# Patient Record
Sex: Female | Born: 1965 | Race: White | Hispanic: No | Smoking: Never smoker
Health system: Southern US, Community
[De-identification: ages and names within clinical notes are randomized; demographics above are authoritative.]

## PROBLEM LIST (undated history)

## (undated) DIAGNOSIS — E785 Hyperlipidemia, unspecified: Secondary | ICD-10-CM

## (undated) DIAGNOSIS — F5104 Psychophysiologic insomnia: Secondary | ICD-10-CM

## (undated) DIAGNOSIS — C4491 Basal cell carcinoma of skin, unspecified: Secondary | ICD-10-CM

## (undated) DIAGNOSIS — T7840XA Allergy, unspecified, initial encounter: Secondary | ICD-10-CM

## (undated) DIAGNOSIS — F419 Anxiety disorder, unspecified: Secondary | ICD-10-CM

## (undated) HISTORY — DX: Anxiety disorder, unspecified: F41.9

## (undated) HISTORY — PX: AUGMENTATION MAMMAPLASTY: SUR837

## (undated) HISTORY — DX: Psychophysiologic insomnia: F51.04

## (undated) HISTORY — DX: Allergy, unspecified, initial encounter: T78.40XA

## (undated) HISTORY — PX: COSMETIC SURGERY: SHX468

## (undated) HISTORY — DX: Hyperlipidemia, unspecified: E78.5

## (undated) HISTORY — DX: Basal cell carcinoma of skin, unspecified: C44.91

## (undated) HISTORY — PX: SALPINGOOPHORECTOMY: SHX82

---

## 1999-06-02 ENCOUNTER — Encounter: Payer: Self-pay | Admitting: Emergency Medicine

## 1999-06-02 ENCOUNTER — Emergency Department (HOSPITAL_COMMUNITY): Admission: EM | Admit: 1999-06-02 | Discharge: 1999-06-02 | Payer: Self-pay | Admitting: Emergency Medicine

## 2004-05-27 ENCOUNTER — Emergency Department: Payer: Self-pay | Admitting: Emergency Medicine

## 2004-05-28 ENCOUNTER — Ambulatory Visit: Payer: Self-pay | Admitting: Emergency Medicine

## 2005-06-26 ENCOUNTER — Emergency Department: Payer: Self-pay | Admitting: Emergency Medicine

## 2006-03-14 ENCOUNTER — Encounter: Payer: Self-pay | Admitting: Family Medicine

## 2006-04-07 ENCOUNTER — Ambulatory Visit: Payer: Self-pay | Admitting: Family Medicine

## 2006-04-09 ENCOUNTER — Ambulatory Visit: Payer: Self-pay | Admitting: Family Medicine

## 2006-04-09 LAB — CONVERTED CEMR LAB
ALT: 17 units/L (ref 0–40)
AST: 17 units/L (ref 0–37)
Albumin: 3.8 g/dL (ref 3.5–5.2)
Alkaline Phosphatase: 29 units/L — ABNORMAL LOW (ref 39–117)
BUN: 8 mg/dL (ref 6–23)
Calcium: 8.7 mg/dL (ref 8.4–10.5)
Chloride: 107 meq/L (ref 96–112)
Creatinine, Ser: 0.8 mg/dL (ref 0.4–1.2)
Direct LDL: 171.6 mg/dL
GFR calc non Af Amer: 84 mL/min
Sodium: 140 meq/L (ref 135–145)
Total Bilirubin: 0.6 mg/dL (ref 0.3–1.2)
Total CHOL/HDL Ratio: 6
VLDL: 10 mg/dL (ref 0–40)

## 2006-04-11 ENCOUNTER — Other Ambulatory Visit: Admission: RE | Admit: 2006-04-11 | Discharge: 2006-04-11 | Payer: Self-pay | Admitting: Family Medicine

## 2006-04-11 ENCOUNTER — Ambulatory Visit: Payer: Self-pay | Admitting: Gastroenterology

## 2006-04-11 ENCOUNTER — Encounter: Payer: Self-pay | Admitting: Gastroenterology

## 2006-04-11 ENCOUNTER — Ambulatory Visit: Payer: Self-pay | Admitting: Family Medicine

## 2006-04-14 ENCOUNTER — Ambulatory Visit: Payer: Self-pay | Admitting: Family Medicine

## 2006-04-18 ENCOUNTER — Ambulatory Visit (HOSPITAL_COMMUNITY): Admission: RE | Admit: 2006-04-18 | Discharge: 2006-04-18 | Payer: Self-pay | Admitting: Family Medicine

## 2006-04-24 ENCOUNTER — Ambulatory Visit: Payer: Self-pay | Admitting: Gastroenterology

## 2006-05-01 ENCOUNTER — Ambulatory Visit: Payer: Self-pay | Admitting: Family Medicine

## 2006-05-07 ENCOUNTER — Encounter: Admission: RE | Admit: 2006-05-07 | Discharge: 2006-05-07 | Payer: Self-pay | Admitting: Family Medicine

## 2006-07-16 ENCOUNTER — Telehealth (INDEPENDENT_AMBULATORY_CARE_PROVIDER_SITE_OTHER): Payer: Self-pay | Admitting: *Deleted

## 2006-07-17 ENCOUNTER — Ambulatory Visit: Payer: Self-pay | Admitting: Family Medicine

## 2006-08-05 ENCOUNTER — Telehealth (INDEPENDENT_AMBULATORY_CARE_PROVIDER_SITE_OTHER): Payer: Self-pay | Admitting: *Deleted

## 2006-10-06 ENCOUNTER — Ambulatory Visit: Payer: Self-pay | Admitting: Family Medicine

## 2006-10-06 DIAGNOSIS — R079 Chest pain, unspecified: Secondary | ICD-10-CM | POA: Insufficient documentation

## 2006-10-06 DIAGNOSIS — F411 Generalized anxiety disorder: Secondary | ICD-10-CM | POA: Insufficient documentation

## 2006-10-08 ENCOUNTER — Ambulatory Visit: Payer: Self-pay | Admitting: Internal Medicine

## 2006-10-15 ENCOUNTER — Telehealth (INDEPENDENT_AMBULATORY_CARE_PROVIDER_SITE_OTHER): Payer: Self-pay | Admitting: *Deleted

## 2006-10-29 ENCOUNTER — Encounter: Payer: Self-pay | Admitting: Family Medicine

## 2006-10-29 DIAGNOSIS — R87619 Unspecified abnormal cytological findings in specimens from cervix uteri: Secondary | ICD-10-CM | POA: Insufficient documentation

## 2006-10-31 ENCOUNTER — Encounter: Admission: RE | Admit: 2006-10-31 | Discharge: 2006-10-31 | Payer: Self-pay | Admitting: Family Medicine

## 2006-11-11 ENCOUNTER — Encounter (INDEPENDENT_AMBULATORY_CARE_PROVIDER_SITE_OTHER): Payer: Self-pay | Admitting: *Deleted

## 2007-02-06 ENCOUNTER — Ambulatory Visit: Payer: Self-pay | Admitting: Family Medicine

## 2007-03-24 ENCOUNTER — Ambulatory Visit: Payer: Self-pay | Admitting: Family Medicine

## 2007-03-30 ENCOUNTER — Telehealth: Payer: Self-pay | Admitting: Family Medicine

## 2007-03-30 DIAGNOSIS — J329 Chronic sinusitis, unspecified: Secondary | ICD-10-CM | POA: Insufficient documentation

## 2007-03-31 ENCOUNTER — Telehealth: Payer: Self-pay | Admitting: Family Medicine

## 2007-04-14 ENCOUNTER — Telehealth: Payer: Self-pay | Admitting: Family Medicine

## 2007-04-16 DIAGNOSIS — G4700 Insomnia, unspecified: Secondary | ICD-10-CM | POA: Insufficient documentation

## 2007-05-15 ENCOUNTER — Ambulatory Visit: Payer: Self-pay | Admitting: Family Medicine

## 2007-06-02 ENCOUNTER — Ambulatory Visit: Payer: Self-pay | Admitting: Family Medicine

## 2007-06-02 DIAGNOSIS — J309 Allergic rhinitis, unspecified: Secondary | ICD-10-CM | POA: Insufficient documentation

## 2007-06-03 ENCOUNTER — Ambulatory Visit (HOSPITAL_COMMUNITY): Admission: RE | Admit: 2007-06-03 | Discharge: 2007-06-03 | Payer: Self-pay | Admitting: Family Medicine

## 2007-06-08 ENCOUNTER — Encounter: Admission: RE | Admit: 2007-06-08 | Discharge: 2007-06-08 | Payer: Self-pay | Admitting: Family Medicine

## 2007-06-11 ENCOUNTER — Encounter (INDEPENDENT_AMBULATORY_CARE_PROVIDER_SITE_OTHER): Payer: Self-pay | Admitting: *Deleted

## 2007-08-12 ENCOUNTER — Telehealth: Payer: Self-pay | Admitting: Family Medicine

## 2008-01-28 ENCOUNTER — Emergency Department (HOSPITAL_COMMUNITY): Admission: EM | Admit: 2008-01-28 | Discharge: 2008-01-28 | Payer: Self-pay | Admitting: Family Medicine

## 2008-08-31 ENCOUNTER — Ambulatory Visit: Payer: Self-pay | Admitting: Family Medicine

## 2008-08-31 ENCOUNTER — Encounter: Payer: Self-pay | Admitting: Family Medicine

## 2008-08-31 ENCOUNTER — Other Ambulatory Visit: Admission: RE | Admit: 2008-08-31 | Discharge: 2008-08-31 | Payer: Self-pay | Admitting: Family Medicine

## 2008-08-31 DIAGNOSIS — D239 Other benign neoplasm of skin, unspecified: Secondary | ICD-10-CM | POA: Insufficient documentation

## 2008-09-07 ENCOUNTER — Encounter: Payer: Self-pay | Admitting: Family Medicine

## 2008-09-07 ENCOUNTER — Ambulatory Visit: Payer: Self-pay | Admitting: Family Medicine

## 2008-09-07 ENCOUNTER — Encounter (INDEPENDENT_AMBULATORY_CARE_PROVIDER_SITE_OTHER): Payer: Self-pay | Admitting: *Deleted

## 2008-10-12 ENCOUNTER — Telehealth: Payer: Self-pay | Admitting: Family Medicine

## 2008-12-12 ENCOUNTER — Telehealth: Payer: Self-pay | Admitting: Family Medicine

## 2009-01-20 ENCOUNTER — Telehealth: Payer: Self-pay | Admitting: Family Medicine

## 2009-01-25 ENCOUNTER — Ambulatory Visit: Payer: Self-pay | Admitting: Sports Medicine

## 2009-02-20 ENCOUNTER — Telehealth: Payer: Self-pay | Admitting: Family Medicine

## 2009-03-31 ENCOUNTER — Telehealth: Payer: Self-pay | Admitting: Family Medicine

## 2009-04-03 ENCOUNTER — Emergency Department (HOSPITAL_COMMUNITY): Admission: EM | Admit: 2009-04-03 | Discharge: 2009-04-03 | Payer: Self-pay | Admitting: Emergency Medicine

## 2009-05-03 ENCOUNTER — Telehealth: Payer: Self-pay | Admitting: Family Medicine

## 2009-06-20 ENCOUNTER — Telehealth: Payer: Self-pay | Admitting: Family Medicine

## 2009-09-18 ENCOUNTER — Telehealth (INDEPENDENT_AMBULATORY_CARE_PROVIDER_SITE_OTHER): Payer: Self-pay | Admitting: *Deleted

## 2009-09-18 ENCOUNTER — Ambulatory Visit: Payer: Self-pay | Admitting: Family Medicine

## 2009-09-18 ENCOUNTER — Telehealth: Payer: Self-pay | Admitting: Family Medicine

## 2009-09-19 ENCOUNTER — Telehealth: Payer: Self-pay | Admitting: Family Medicine

## 2009-09-22 ENCOUNTER — Ambulatory Visit: Payer: Self-pay | Admitting: Family Medicine

## 2009-09-22 ENCOUNTER — Other Ambulatory Visit: Admission: RE | Admit: 2009-09-22 | Discharge: 2009-09-22 | Payer: Self-pay | Admitting: Family Medicine

## 2009-09-22 DIAGNOSIS — Z85828 Personal history of other malignant neoplasm of skin: Secondary | ICD-10-CM | POA: Insufficient documentation

## 2009-09-22 LAB — CONVERTED CEMR LAB
Albumin: 4.2 g/dL (ref 3.5–5.2)
Alkaline Phosphatase: 33 units/L — ABNORMAL LOW (ref 39–117)
Calcium: 9 mg/dL (ref 8.4–10.5)
Eosinophils Relative: 2.5 % (ref 0.0–5.0)
GFR calc non Af Amer: 82.91 mL/min (ref >60)
Glucose, Bld: 90 mg/dL (ref 70–99)
HCT: 39.7 % (ref 36.0–46.0)
HDL: 51.5 mg/dL (ref >39.00)
Hemoglobin: 13.7 g/dL (ref 12.0–15.0)
Lymphs Abs: 1.4 10*3/uL (ref 0.7–4.0)
MCV: 91.9 fL (ref 78.0–100.0)
Monocytes Relative: 6.3 % (ref 3.0–12.0)
Neutro Abs: 2.6 10*3/uL (ref 1.4–7.7)
RDW: 13.2 % (ref 11.5–14.6)
Sodium: 141 meq/L (ref 135–145)
Total Bilirubin: 0.6 mg/dL (ref 0.3–1.2)
Triglycerides: 73 mg/dL (ref 0.0–149.0)
VLDL: 14.6 mg/dL (ref 0.0–40.0)
WBC: 4.5 10*3/uL (ref 4.5–10.5)

## 2009-09-22 LAB — HM PAP SMEAR

## 2009-09-29 ENCOUNTER — Encounter (INDEPENDENT_AMBULATORY_CARE_PROVIDER_SITE_OTHER): Payer: Self-pay | Admitting: *Deleted

## 2009-10-02 ENCOUNTER — Encounter: Admission: RE | Admit: 2009-10-02 | Discharge: 2009-10-02 | Payer: Self-pay | Admitting: Family Medicine

## 2009-11-13 ENCOUNTER — Ambulatory Visit: Payer: Self-pay | Admitting: Family Medicine

## 2009-11-13 DIAGNOSIS — E785 Hyperlipidemia, unspecified: Secondary | ICD-10-CM | POA: Insufficient documentation

## 2009-12-05 ENCOUNTER — Telehealth: Payer: Self-pay | Admitting: Family Medicine

## 2009-12-06 ENCOUNTER — Ambulatory Visit: Payer: Self-pay | Admitting: Family Medicine

## 2009-12-06 DIAGNOSIS — K270 Acute peptic ulcer, site unspecified, with hemorrhage: Secondary | ICD-10-CM | POA: Insufficient documentation

## 2009-12-06 DIAGNOSIS — K219 Gastro-esophageal reflux disease without esophagitis: Secondary | ICD-10-CM | POA: Insufficient documentation

## 2009-12-07 ENCOUNTER — Encounter: Payer: Self-pay | Admitting: Family Medicine

## 2009-12-12 ENCOUNTER — Encounter: Payer: Self-pay | Admitting: Gastroenterology

## 2009-12-12 ENCOUNTER — Ambulatory Visit: Payer: Self-pay | Admitting: Family Medicine

## 2009-12-14 ENCOUNTER — Telehealth: Payer: Self-pay | Admitting: Family Medicine

## 2009-12-15 ENCOUNTER — Encounter: Payer: Self-pay | Admitting: Family Medicine

## 2009-12-18 ENCOUNTER — Telehealth: Payer: Self-pay | Admitting: Gastroenterology

## 2009-12-18 LAB — CONVERTED CEMR LAB
ALT: 25 units/L (ref 0–35)
AST: 21 units/L (ref 0–37)
Alkaline Phosphatase: 35 units/L — ABNORMAL LOW (ref 39–117)
BUN: 10 mg/dL (ref 6–23)
Bilirubin, Direct: 0.1 mg/dL (ref 0.0–0.3)
Calcium: 9.3 mg/dL (ref 8.4–10.5)
Creatinine, Ser: 0.7 mg/dL (ref 0.4–1.2)
GFR calc non Af Amer: 99.9 mL/min (ref >60)
Glucose, Bld: 74 mg/dL (ref 70–99)
Total Bilirubin: 0.4 mg/dL (ref 0.3–1.2)

## 2009-12-19 ENCOUNTER — Ambulatory Visit: Payer: Self-pay | Admitting: Gastroenterology

## 2009-12-19 ENCOUNTER — Encounter: Payer: Self-pay | Admitting: Physician Assistant

## 2009-12-19 LAB — CONVERTED CEMR LAB: Beta hcg, urine, semiquantitative: NEGATIVE

## 2009-12-21 ENCOUNTER — Ambulatory Visit: Payer: Self-pay | Admitting: Cardiology

## 2009-12-22 ENCOUNTER — Telehealth: Payer: Self-pay | Admitting: Gastroenterology

## 2009-12-25 LAB — CONVERTED CEMR LAB
Eosinophils Relative: 11.2 % — ABNORMAL HIGH (ref 0.0–5.0)
HCT: 39.4 % (ref 36.0–46.0)
Lymphs Abs: 2.3 10*3/uL (ref 0.7–4.0)
MCV: 91.1 fL (ref 78.0–100.0)
Monocytes Absolute: 0.5 10*3/uL (ref 0.1–1.0)
Platelets: 208 10*3/uL (ref 150.0–400.0)
RDW: 12.7 % (ref 11.5–14.6)
WBC: 8 10*3/uL (ref 4.5–10.5)

## 2009-12-26 ENCOUNTER — Encounter: Payer: Self-pay | Admitting: Physician Assistant

## 2009-12-26 ENCOUNTER — Encounter: Payer: Self-pay | Admitting: Gastroenterology

## 2009-12-28 ENCOUNTER — Ambulatory Visit: Payer: Self-pay | Admitting: Physician Assistant

## 2010-02-09 ENCOUNTER — Telehealth: Payer: Self-pay | Admitting: Family Medicine

## 2010-02-14 ENCOUNTER — Ambulatory Visit
Admission: RE | Admit: 2010-02-14 | Discharge: 2010-02-14 | Payer: Self-pay | Source: Home / Self Care | Attending: Gastroenterology | Admitting: Gastroenterology

## 2010-02-14 ENCOUNTER — Encounter: Payer: Self-pay | Admitting: Gastroenterology

## 2010-02-19 ENCOUNTER — Encounter: Payer: Self-pay | Admitting: Gastroenterology

## 2010-02-26 ENCOUNTER — Telehealth: Payer: Self-pay | Admitting: Gastroenterology

## 2010-03-01 ENCOUNTER — Telehealth: Payer: Self-pay | Admitting: Family Medicine

## 2010-03-04 ENCOUNTER — Encounter: Payer: Self-pay | Admitting: Family Medicine

## 2010-03-05 ENCOUNTER — Encounter: Payer: Self-pay | Admitting: Family Medicine

## 2010-03-08 ENCOUNTER — Ambulatory Visit
Admission: RE | Admit: 2010-03-08 | Discharge: 2010-03-08 | Payer: Self-pay | Source: Home / Self Care | Attending: Gastroenterology | Admitting: Gastroenterology

## 2010-03-08 ENCOUNTER — Telehealth: Payer: Self-pay | Admitting: Family Medicine

## 2010-03-13 NOTE — Progress Notes (Signed)
Summary: Casey Scott  Phone Note Refill Request Message from:  Scriptline on September 18, 2009 10:10 AM  Refills Requested: Medication #1:  ZOLPIDEM TARTRATE 10 MG TABS 1 tab by mouth at bedtime as needed insomnia   Supply Requested: 1 month [place] pharmacy   Method Requested: Telephone to Pharmacy Initial call taken by: Benny Lennert CMA Duncan Dull),  September 18, 2009 10:10 AM  Follow-up for Phone Call        Rx called to pharmacy Follow-up by: Benny Lennert CMA Duncan Dull),  September 19, 2009 2:10 PM    Prescriptions: ZOLPIDEM TARTRATE 10 MG TABS (ZOLPIDEM TARTRATE) 1 tab by mouth at bedtime as needed insomnia  #90 x 0   Entered and Authorized by:   Kerby Nora MD   Signed by:   Kerby Nora MD on 09/19/2009   Method used:   Telephoned to ...       [facility] (retail)       [address]       [place], [place]  [id number]       Ph: [phone]       Fax: [phone]   RxID:   [id number]

## 2010-03-13 NOTE — Letter (Signed)
Summary: Handout Printed  Printed Handout:  - Thoracic Strain

## 2010-03-13 NOTE — Progress Notes (Signed)
Summary: CT results   Phone Note Call from Patient Call back at [phone]   Caller: Patient Call For: Dr. Arlyce Dice Details for Reason: CT Results Summary of Call: Please call patient with CT results from yesterday. Initial call taken by: Schuyler Amor,  December 22, 2009 11:26 AM  Follow-up for Phone Call        patient advised we will call her back once [facility] PA has reviewed the CT  Follow-up by: Darcey Nora RN, CGRN,  December 22, 2009 11:38 AM

## 2010-03-13 NOTE — Progress Notes (Signed)
Summary: Sooner appt   Phone Note From Other Clinic Call back at [phone] Patient's direct line   Caller: [phone] Shirlee Limerick at Dr Ermalene Searing, [address] [facility] Call For: Dr Arlyce Dice Reason for Call: Schedule Patient Appt Summary of Call: Has an appoinment scheduled for Dec 9 with Dr Arlyce Dice and wonders if she can be seen sooner for diarrhea. 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 Initial call taken by: Leanor Kail [facility],  December 18, 2009 8:11 AM  Follow-up for Phone Call        Patient  is scheduled for NP3 with Mike Gip PA for tomorrow at 2:00 Follow-up by: Darcey Nora RN, CGRN,  December 18, 2009 8:33 AM

## 2010-03-13 NOTE — Assessment & Plan Note (Signed)
Summary: CPX/CLE   Vital Signs:  Patient profile:   45 year old female Height:      66 inches Weight:      154.8 pounds BMI:     25.08 Temp:     98.5 degrees F oral Pulse rate:   84 / minute Pulse rhythm:   regular BP sitting:   100 / 80  (left arm) Cuff size:   regular  Vitals Entered By: Benny Lennert CMA Duncan Dull) (September 22, 2009 11:37 AM)  History of Present Illness: Chief complaint cpx  The patient is here for annual wellness exam and preventative care.     Chronic insomnia ... moderate control with ambien  Cholesterol, improved control since 2008.  On fish oil. Working on lifestyle.  Family history of colonoscancer..last colonoscopy per pt 2010.Marland Kitchenon five year recall.  Seen derm in last year.Marland Kitchenarea removed on back was dysplastic nevi.  Seen ORTHo for knee pain...bad experience. SE to injected cortisone.   Then saw Dr. Evelina Dun ..given orthotics..significant improvement.  Problems Prior to Update: 1)  Encounter For Long-term Use of Other Medications  (ICD-V58.69) 2)  Screening For Diabetes Mellitus  (ICD-V77.1) 3)  Screening For Lipoid Disorders  (ICD-V77.91) 4)  Routine Gynecological Examination  (ICD-V72.31) 5)  Preventive Health Care  (ICD-V70.0) 6)  Dysplastic Nevus  (ICD-216.9) 7)  Allergic Rhinitis  (ICD-477.9) 8)  Insomnia, Chronic  (ICD-307.42) 9)  Sinusitis, Chronic  (ICD-473.9) 10)  Hx, Family, Malignancy, Gi Tract  (ICD-V16.0) 11)  Pap Smear, Abnormal  (ICD-795.00) 12)  Anxiety State Nos  (ICD-300.00) 13)  Chest Pain  (ICD-786.50)  Current Medications (verified): 1)  Fish Oil 1000 Mg Caps (Omega-3 Fatty Acids) .... Take 1 Capsule By Mouth Two Times A Day 2)  Alprazolam 0.25 Mg  Tabs (Alprazolam) .Marland Kitchen.. 1-2 Tab By Mouth Daily As Needed Anxiety 3)  Vitamin C 500 Mg  Tabs (Ascorbic Acid) .... Take 1 Tablet By Mouth Once A Day 4)  Fluticasone Propionate 50 Mcg/act  Susp (Fluticasone Propionate) .... 2 Sprays Per Nostril Daily 5)  Proair Hfa 108 (90 Base)  Mcg/act  Aers (Albuterol Sulfate) .... 2 Pufffs Inh Q 4 Hours As Needed Wheeze 6)  Zolpidem Tartrate 10 Mg Tabs (Zolpidem Tartrate) .Marland Kitchen.. 1 Tab By Mouth At Bedtime As Needed Insomnia 7)  Glucosamine 500 Mg Caps (Glucosamine Sulfate) .... 2 Tabs Daily  Allergies (verified): No Known Drug Allergies  Past History:  Past medical, surgical, family and social histories (including risk factors) reviewed, and no changes noted (except as noted below).  Past Surgical History: Reviewed history from 10/29/2006 and no changes required. 1998    Ectopic pregnancy - right ovary/tube removed 2000    Nasal surgery, miscarriages x 2  Family History: Reviewed history from 10/29/2006 and no changes required. Father: Died 50 colon CA, seizures Mother: Died 6 colon CA Siblings: 1 brother, DM, HTN, high cholesterol, TIA No MI < 65 DM:  MGF Colon CA:  Mom - age 77                   Dad - age 43  Social History: Reviewed history from 10/29/2006 and no changes required. 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 CV:  Denies chest pain or discomfort. Resp:  Denies shortness of breath. GI:  Denies abdominal pain, bloody  stools, constipation, and diarrhea. GU:  Denies dysuria. Derm:  Denies itching and rash. Psych:  Denies anxiety and depression. Endo:  Denies cold intolerance, excessive urination, and heat intolerance.  Physical Exam  General:  Well-developed,well-nourished,in no acute distress; alert,appropriate and cooperative throughout examination Eyes:  No corneal or conjunctival inflammation noted. EOMI. Perrla. Funduscopic exam benign, without hemorrhages, exudates or papilledema. Vision grossly normal. Ears:  External ear exam shows no significant lesions or deformities.   Otoscopic examination reveals clear canals, tympanic membranes are intact bilaterally without bulging, retraction, inflammation or discharge. Hearing is grossly normal bilaterally. Nose:  External nasal examination shows no deformity or inflammation. Nasal mucosa are pink and moist without lesions or exudates. Mouth:  Oral mucosa and oropharynx without lesions or exudates.  Teeth in good repair. Neck:  no cervical or supraclavicular lymphadenopathy  no carotid bruit or thyromegaly  Chest Wall:  No deformities, masses, or tenderness noted. Breasts:  no masses, skin/areolae normal, no abnormal thickening, no nipple discharge, and no tenderness.   B breasst implants in place. Lungs:  Normal respiratory effort, chest expands symmetrically. Lungs are clear to auscultation, no crackles or wheezes. Heart:  Normal rate and regular rhythm. S1 and S2 normal without gallop, murmur, click, rub or other extra sounds. Abdomen:  Bowel sounds positive,abdomen soft and non-tender without masses, organomegaly or hernias noted. Genitalia:  Pelvic Exam:        External: normal female genitalia without lesions or masses        Vagina: normal without lesions or masses        Cervix: normal without lesions or masses        Adnexa: normal bimanual exam without masses or fullness        Uterus: normal by palpation        Pap smear: performed Pulses:  R and L posterior tibial pulses are full and equal bilaterally  Extremities:  no edema Skin:  Intact without suspicious lesions or rashes Psych:  Cognition and judgment appear intact. Alert and cooperative with normal attention span and concentration. No apparent delusions, illusions, hallucinations   Impression & Recommendations:  Problem # 1:  PREVENTIVE HEALTH CARE (ICD-V70.0) The patient's preventative maintenance and recommended screening tests for an annual wellness exam were reviewed in full today. Brought up to date unless services declined.  Counselled on  the importance of diet, exercise, and its role in overall health and mortality. The patient's FH and SH was reviewed, including their home life, tobacco status, and drug and alcohol status.   Reviewed cholesterol in detail with pt. Continue fish oil 2000 mg divided daily.     Problem # 2:  ROUTINE GYNECOLOGICAL EXAMINATION (ICD-V72.31) PAP pending. Referred for mammogram.  Complete Medication List: 1)  Fish Oil 1000 Mg Caps (Omega-3 fatty acids) .... Take 1 capsule by mouth two times a day 2)  Alprazolam 0.25 Mg Tabs (Alprazolam) .Marland Kitchen.. 1-2 tab by mouth daily as needed anxiety 3)  Vitamin C 500 Mg Tabs (Ascorbic acid) .... Take 1 tablet by mouth once a day 4)  Fluticasone Propionate 50 Mcg/act Susp (Fluticasone propionate) .... 2 sprays per nostril daily 5)  Proair Hfa 108 (90 Base) Mcg/act Aers (Albuterol sulfate) .... 2 pufffs inh q 4 hours as needed wheeze 6)  Zolpidem Tartrate 10 Mg Tabs (Zolpidem tartrate) .Marland Kitchen.. 1 tab by mouth at bedtime as needed insomnia 7)  Glucosamine 500 Mg Caps (Glucosamine sulfate) .... 2 tabs daily  Other Orders: Radiology Referral (Radiology)  Patient Instructions: 1)  Referral Appointment Information 2)  Day/Date: 3)  Time: 4)  Place/MD: 5)  Address: 6)  Phone/Fax: 7)  Patient given appointment information. Information/Orders faxed/mailed.   Current Allergies (reviewed today): No known allergies    Past Surgical History:    Reviewed history from 10/29/2006 and no changes required:       1998    Ectopic pregnancy - right ovary/tube removed       2000    Nasal surgery, miscarriages x 2

## 2010-03-13 NOTE — Assessment & Plan Note (Signed)
Summary: BACK PAIN/EVM   j  Vital Signs:  Patient Profile:   45 Years Old Female CC:      Back Pain/ RLH Height:     66 inches (166.37 cm) Weight:      150.50 pounds BMI:     24.38 Temp:     98.6 degrees F oral Pulse rate:   66 / minute Pulse rhythm:   regular Resp:     20 per minute BP sitting:   108 / 60  (left arm)  Pt. in pain?   yes    Location:   lower back    Intensity:   4    Type:       sharp  Vitals Entered By: Ma Hillock LPN              Is Patient Diabetic? No      Current Allergies: No known allergies History of Present Illness History from: patient Reason for visit: see chief complaint Chief Complaint: Back Pain/ RLH History of Present Illness: The patient is presenting today complaining of back pain and spasm for the past 2 days. The patient reported that she has been moving for the past 3 days. The patient reports that she had an injury when she was lifting some furniture. The patient reports that she's having a stabbing pain in the left upper thoracic area of the back. The patient reports that she's been having trouble sleeping because of the stabbing nature of the pain. She reports that she has been taking 12 ibuprofen tablets per day. She also reported that she was taking some treatments with a heating pad. The patient reported that she would like to get some rest and to stop the pain that she's been experiencing. 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  REVIEW OF SYSTEMS Constitutional Symptoms      Denies fever, chills, night sweats, weight loss, weight gain, and fatigue.  Eyes       Denies change in vision, eye pain, eye discharge, glasses, contact lenses, and eye surgery. Ear/Nose/Throat/Mouth       Denies hearing loss/aids, change in hearing, ear pain, ear discharge, dizziness, frequent runny nose, frequent nose bleeds, sinus problems, sore throat, hoarseness, and tooth pain or bleeding.  Respiratory       Denies dry  cough, productive cough, wheezing, shortness of breath, asthma, bronchitis, and emphysema/COPD.  Cardiovascular       Denies murmurs, chest pain, and tires easily with exhertion.    Gastrointestinal       Denies stomach pain, nausea/vomiting, diarrhea, constipation, blood in bowel movements, and indigestion. Genitourniary       Denies painful urination, kidney stones, and loss of urinary control. Neurological       Denies paralysis, seizures, and fainting/blackouts. Musculoskeletal       Complains of muscle pain.      Denies joint pain, joint stiffness, decreased range of motion, redness, swelling, muscle weakness, and gout.      Comments: pain and stiffness in the left upper back Skin       Denies bruising, unusual mles/lumps or sores, and hair/skin or nail changes.  Psych       Denies mood changes, temper/anger issues, anxiety/stress, speech problems, depression, and sleep problems.  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 Father: Died 71 colon CA, seizures Mother:  Died 44 colon CA Siblings: 1 brother, DM, HTN, high cholesterol, TIA No MI < 65 DM:  MGF Colon CA:  Mom - age 86                   Dad - age 43  Social History: Reviewed history from 10/29/2006 and no changes required. 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 Alcohol use-yes, socially, 2 drinks/month Drug use-no Regular exercise-yes, 5 x/ week Diet:  fruit/veggies, (+) H2O Recently moved to [place] area Physical Exam General appearance: well developed, well nourished, no acute distress Head: normocephalic, atraumatic Eyes: conjunctivae and lids normal Pupils: equal, round, reactive to light Ears: normal, no lesions or deformities Nasal: mucosa pink, nonedematous, no septal deviation, turbinates normal Oral/Pharynx: tongue normal, posterior pharynx without erythema or exudate, dry mucus membranes Neck: neck supple,  trachea midline, no masses Chest/Lungs: no rales, wheezes, or rhonchi bilateral, breath sounds equal without effort Heart: regular rate and  rhythm, no murmur Abdomen: soft, non-tender without obvious organomegaly Extremities: normal extremities Neurological: grossly intact and non-focal Back: tender musculature left mid-upper back,  straight leg raises negative bilaterally, deep tendon reflexes 2+ at achilles and patella Skin: no obvious rashes or lesions MSE: oriented to time, place, and person Assessment  Assessed HYPERLIPIDEMIA as unchanged - Standley Dakins MD Assessed ANXIETY STATE NOS as improved - Clanford Johnson MD New Problems: BACK PAIN (ICD-724.5) MUSCLE SPASM, BACK (ICD-724.8) MUSCLE SPASM (ICD-728.85) DEHYDRATION (ICD-276.51) HYPERLIPIDEMIA (ICD-272.4)   Patient Education: Patient and/or caregiver instructed in the following: rest, fluids, Ibuprofen prn, exercise. See Handouts given to patient Demonstrates willingness to comply.  Plan New Medications/Changes: IBUPROFEN 800 MG TABS  (IBUPROFEN) take 1 by mouth q 8 hours as needed back pain, Take with food  #15 x 0, 11/13/2009, Clanford Johnson MD CYCLOBENZAPRINE HCL 5 MG TABS (CYCLOBENZAPRINE HCL) take 1 by mouth up to three times a day as needed muscle spasm.  Caution: WILL CAUSE DROWSINESS  #15 x 0, 11/13/2009, Standley Dakins MD  New Orders: Ketorolac-Toradol 15mg  [Z6109] Admin of Therapeutic Inj  intramuscular or subcutaneous [96372] Est. Patient Level IV [60454] Prescription Created Electronically [phone] Follow Up: Follow up in 1-2 days if no improvement, Follow up with Primary Physician  The patient and/or caregiver has been counseled thoroughly with regard to medications prescribed including dosage, schedule, interactions, rationale for use, and possible side effects and they verbalize understanding.  Diagnoses and expected course of recovery discussed and will return if not improved as expected or if the condition worsens. Patient and/or caregiver verbalized understanding.  Prescriptions: IBUPROFEN 800 MG TABS (  IBUPROFEN) take 1 by mouth q 8 hours as needed back pain, Take with food  #15 x 0   Entered and Authorized by:   Standley Dakins MD   Signed by:   Standley Dakins MD on 11/13/2009   Method used:   Electronically to        [facility] [address] (retail)       [address], [address]       [place], [place]  [address]       Ph: [phone]       Fax: [phone]   RxID:   [id number] CYCLOBENZAPRINE HCL 5 MG TABS (CYCLOBENZAPRINE HCL) take 1 by mouth up to three times a day as needed muscle spasm.  Caution: WILL CAUSE DROWSINESS  #15 x 0   Entered and Authorized by:   Standley Dakins MD   Signed by:   Standley Dakins MD on 11/13/2009   Method used:   Electronically to        [facility] [address] (retail)       [address], [address]       [place], [place]  [address]       Ph: [phone]       Fax: [phone]   RxID:    [id number]   Patient Instructions: 1)  There is no on-call Cala Kruckenberg or services available at this clinic during off-hours (when the clinic is closed).  If the patient developed a problem or concern that required immediate attention, the patient was advised to go the the nearest available urgent care or emergency department for medical care.  The patient verbalized understanding.    2)  Avoid driving and / or operating a motor vehicle while under the influence of the meds prescribed today.  3)  This [facility] is not intended to be a primary care clinic.  The patient is always better served by the continuity of care and the Lada Fulbright/patient relationships developed with their dedicated primary care Mihail Prettyman.  The patient was told to be sure to follow up as soon as possible with their primary care Mazie Fencl to discuss treatments received and to receive further examination and testing.  The patient verbalized understanding. The will f/u with PCP ASAP.  4)  If this doesn't improve or worsens please seek medical care as soon as possible.  5)  Take Ibuprofen prescribed with food every 8 hours as needed for relief of pain or comfort of fever.  Medication Administration  Injection # 1:    Medication: Ketorolac-Toradol 60mg     Diagnosis: MUSCLE SPASM, BACK (ICD-724.8)    Route: IM    Site: RUOQ gluteus    Exp Date: 01/12/2011    Lot #: [id number]    Mfr: [employer]    Comments: Administered medication per MD order.  Assessed pt. for allergies.  Administered injection without complications, pt. tolerated it well.    Patient tolerated injection without complications    Given by: R. Hils LPN  Orders Added: 1)  Ketorolac-Toradol 15mg  [J1885] 2)  Admin of Therapeutic Inj  intramuscular or subcutaneous [96372] 3)  Est. Patient Level IV [73220] 4)  Prescription Created Electronically [phone]  The risks, benefits and possible side effects were clearly explained and discussed with the patient.   The patient verbalized clear understanding.  The patient was given instructions to return if symptoms don't improve, worsen or new changes  develop.  If it is not during clinic hours and the patient cannot get back to this clinic then the patient was told to seek medical care at an available urgent care or emergency department.  The patient verbalized understanding.    The patient was informed that there is no on-call Roald Lukacs or services available at this clinic during off-hours (when the clinic is closed).  If the patient developed a problem or concern that required immediate attention, the patient was advised to go the the nearest available urgent care or emergency department for medical care.  The patient verbalized understanding.     The patient's prescriptions were checked for possible interactions and electronically sent to the pharmacy of choice.

## 2010-03-13 NOTE — Assessment & Plan Note (Signed)
Summary: STOMACH PAIN,NAUSEA/JBB   Vital Signs:  Patient profile:   45 year old female Weight:      146 pounds Temp:     98.4 degrees F oral Pulse rate:   85 / minute Pulse rhythm:   regular Resp:     16 per minute  Chief Complaint:  abdominal pain.  History of Present Illness: Pt presented to the clinic today to have an evaluation done for abdominal pain that has persisted for 2 months.  Pt says that she has lost 25 lbs and having pain and loose stools for every meal and daily explosive diarrhea.  Pt had a "reported normal" colonoscopy last year.  Pt has also reported that her father had Chron's disease.  Pt also says that her abdomen is hurting very badly.  No nausea or vomiting reported.  Pt says she is having no fever or chills.  Pt says that she has pain with every meal.  Pt says that she had a severe bout of abdominal pain several years ago but this episode currently is worse because she is having fatigue and poor appetite.  She said that she has a history of an ulcer.  She has no current acid reflux symptoms but is under tremendous stress.  Pt say no tarry black or bloody stools seen.  Pt says that her bowel sounds are very loud.   Problems Prior to Update: 1)  Hyperlipidemia  (ICD-272.4) 2)  Basal Cell Carcinoma, Hx of  (ICD-V10.83) 3)  Other Screening Mammogram  (ICD-V76.12) 4)  Encounter For Long-term Use of Other Medications  (ICD-V58.69) 5)  Screening For Diabetes Mellitus  (ICD-V77.1) 6)  Screening For Lipoid Disorders  (ICD-V77.91) 7)  Routine Gynecological Examination  (ICD-V72.31) 8)  Preventive Health Care  (ICD-V70.0) 9)  Dysplastic Nevus  (ICD-216.9) 10)  Allergic Rhinitis  (ICD-477.9) 11)  Insomnia, Chronic  (ICD-307.42) 12)  Sinusitis, Chronic  (ICD-473.9) 13)  Hx, Family, Malignancy, Gi Tract  (ICD-V16.0) 14)  Pap Smear, Abnormal  (ICD-795.00) 15)  Anxiety State Nos  (ICD-300.00) 16)  Chest Pain  (ICD-786.50)  Medications Prior to Update: 1)  Fish Oil 1000 Mg  Caps (Omega-3 Fatty Acids) .... Take 1 Capsule By Mouth Two Times A Day 2)  Alprazolam 0.25 Mg  Tabs (Alprazolam) .Marland Kitchen.. 1-2 Tab By Mouth Daily As Needed Anxiety 3)  Vitamin C 500 Mg  Tabs (Ascorbic Acid) .... Take 1 Tablet By Mouth Once A Day 4)  Fluticasone Propionate 50 Mcg/act  Susp (Fluticasone Propionate) .... 2 Sprays Per Nostril Daily 5)  Proair Hfa 108 (90 Base) Mcg/act  Aers (Albuterol Sulfate) .... 2 Pufffs Inh Q 4 Hours As Needed Wheeze 6)  Zolpidem Tartrate 10 Mg Tabs (Zolpidem Tartrate) .Marland Kitchen.. 1 Tab By Mouth At Bedtime As Needed Insomnia 7)  Glucosamine 500 Mg Caps (Glucosamine Sulfate) .... 2 Tabs Daily 8)  Red Yeast Rice 600 Mg Tabs (Red Yeast Rice Extract) .... 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  The patient denies fever, weight gain, vision loss, decreased hearing, hoarseness, chest pain, syncope, dyspnea on exertion, peripheral edema,  prolonged cough, headaches, hemoptysis, melena, hematochezia, severe indigestion/heartburn, hematuria, incontinence, muscle weakness, difficulty walking, unusual weight change, abnormal bleeding, enlarged lymph nodes, and angioedema.         Pt also complains of fatigue.    Physical Exam  General:  well developed, well nourished, no acute distress Head:  Normocephalic and atraumatic without obvious abnormalities. No apparent alopecia or balding. Eyes:  No corneal or conjunctival inflammation noted. EOMI. Perrla. Funduscopic exam benign, without hemorrhages, exudates or papilledema. Vision grossly normal. Ears:  External ear exam shows no significant lesions or deformities.  Otoscopic examination reveals clear canals, tympanic membranes are intact bilaterally without bulging, retraction, inflammation or discharge. Hearing is grossly normal bilaterally. Nose:  External nasal examination shows no deformity or inflammation. Nasal mucosa are pink and moist without lesions or exudates. Mouth:  Oral mucosa and oropharynx without lesions or exudates.  Teeth in good repair. Neck:  No deformities, masses, or tenderness noted. Chest Wall:  No deformities, masses, or tenderness noted. Lungs:  Normal respiratory effort, chest expands symmetrically. Lungs are clear to auscultation, no crackles or wheezes. Heart:  Normal rate and regular rhythm. S1 and S2 normal without gallop, murmur, click, rub or other extra sounds. Abdomen:  soft, no guarding, no rigidity, no rebound tenderness, no hepatomegaly, no splenomegaly, bowel sounds hyperactive, epigastric tenderness, RUQ tenderness, and RLQ tenderness.   Msk:  No deformity or scoliosis noted of thoracic or lumbar spine.   Pulses:  R and L carotid,radial,femoral,dorsalis pedis and posterior tibial pulses are full and equal bilaterally Extremities:  No clubbing, cyanosis, edema, or deformity noted with normal full range of motion of all joints.   Skin:  Intact  without suspicious lesions or rashes Cervical Nodes:  No lymphadenopathy noted Axillary Nodes:  No palpable lymphadenopathy Psych:  Cognition and judgment appear intact. Alert and cooperative with normal attention span and concentration. No apparent delusions, illusions, hallucinations   Impression & Recommendations:  Problem # 1:  ABDOMINAL PAIN, GENERALIZED (ICD-789.07)  Orders: Diagnostic Xray/ Fluoroscopy (Diag Xray) Abdominal Series Start Nexium 40 mg daily GI Referral Follow up Xray results  Problem # 2:  WEIGHT LOSS, RECENT (ICD-783.21) See # 1 above  Problem # 3:  GERD (ICD-530.81)  Her updated medication list for this problem includes:    Nexium 40 Mg Cpdr (Esomeprazole magnesium) .Marland Kitchen... 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 Take 1 tablet daily  Complete Medication List: 1)  Fish Oil 1000 Mg Caps (Omega-3 fatty acids) .... Take 1 capsule by mouth two  times a day 2)  Alprazolam 0.25 Mg Tabs (Alprazolam) .Marland Kitchen.. 1-2 tab by mouth daily as needed anxiety 3)  Vitamin C 500 Mg Tabs (Ascorbic acid) .... Take 1 tablet by mouth once a day 4)  Fluticasone Propionate 50 Mcg/act Susp (Fluticasone propionate) .... 2 sprays per nostril daily 5)  Proair Hfa 108 (90 Base) Mcg/act Aers (Albuterol sulfate) .... 2 pufffs inh q 4 hours as needed wheeze 6)  Zolpidem Tartrate 10 Mg Tabs (Zolpidem tartrate) .Marland Kitchen.. 1 tab by mouth at bedtime as needed insomnia 7)  Glucosamine 500 Mg Caps (Glucosamine sulfate) .... 2 tabs daily 8)  Red Yeast Rice 600 Mg Tabs (Red yeast rice extract) .... 2 tabs daily 9)  Nexium 40 Mg Cpdr (Esomeprazole magnesium) .... Take 1 tablet daily  Other Orders: Diagnostic Xray/ Fluoroscopy (Diag  Xray/ Fluoro)  Patient Instructions: 1)  Go get your xrays now 2)  Try Maalox or Mylanta over the counter to help settle your stomach 3)  I recommend that you go to see a GI specialist to have your symptoms evaluated for a possible recurrent ulcer or Chron's disease or IBS.   4)  Start taking the Nexium daily 5)  Avoid NSAIDS (aleve, naproxen, ibuprofen, goody powders, BC powders) 6)  drink plenty of fluids and rest 7)  If your symptoms worsen go the the nearest ER for evaluation and treatment.  8)  The patient was informed that there is no on-call provider or services available at this clinic during off-hours (when the clinic is closed).  If the patient developed a problem or concern that required immediate attention, the patient was advised to go the the nearest available urgent care or emergency department for medical care.  The patient verbalized understanding.    9)  It was clearly explained to the patient that this [facility] is not intended to be a primary care clinic.  The patient is always better served by the continuity of care and the provider/patient relationships developed with their dedicated primary care provider.  The patient was told to be sure to follow up as soon as possible with their primary care provider to discuss treatments received and to receive further examination and testing.  The patient verbalized understanding. The will f/u with PCP ASAP. 10)  The risks, benefits and possible side effects were clearly explained and discussed with the patient.  The patient verbalized clear understanding.  The patient was given instructions to return if symptoms don't improve, worsen or new changes develop.  If it is not during clinic hours and the patient cannot get back to this clinic then the patient was told to seek medical care at an available urgent care or emergency department.  The patient verbalized understanding.   Prescriptions: NEXIUM 40 MG CPDR (ESOMEPRAZOLE MAGNESIUM) take  1 tablet daily  #30 x 3   Entered and Authorized by:   Standley Dakins MD   Signed by:   Standley Dakins MD on 12/06/2009   Method used:   Electronically to        [facility]* (retail)       [address]       [address] Shipping/mailing       [place], [place]  [address]       Ph: [phone]       Fax: [phone]   RxID:   [id number]    Orders Added: 1)  Diagnostic Xray/ Fluoroscopy Frederik Schmidt Xray/ Fluoro]

## 2010-03-13 NOTE — Procedures (Signed)
Summary: [facility] COLON   Procedures Next Due Date:    Colonoscopy: 04/2011 Patient Name: Casey Scott, Casey Scott MRN:  Procedure Procedures: Colonoscopy CPT: [address].  Personnel: Endoscopist: Barbette Hair. Arlyce Dice, MD.  Patient Consent: Procedure, Alternatives, Risks and Benefits discussed, consent obtained, from patient.  Indications  Increased Risk Screening: For family history of colorectal neoplasia, in  parent  Comments: Father and mother with colon cancer, 50's and 40's, respectively History  Current Medications: Patient is not currently taking Coumadin.  Pre-Exam Physical: Performed Apr 24, 2006. Cardio-pulmonary exam, HEENT exam , Abdominal exam, Mental status exam WNL.  Comments: Patient history reviewed/updated, physical performed prior to initiation of sedation?yes Exam Exam: Extent of exam reached: Cecum, extent intended: Cecum.  The cecum was identified by appendiceal orifice and IC valve. Time to Cecum: 00:02: 18. Time for Withdrawl: 00:04:58. Colon retroflexion performed. ASA Classification: I. Tolerance: good.  Monitoring: Pulse and BP monitoring, Oximetry used. Supplemental O2 given. at 2 Liters.  Colon Prep Used Miralax for colon prep. Prep results: excellent.  Sedation Meds: Patient assessed and found to be appropriate for moderate (conscious) sedation. Sedation was managed by the Endoscopist. Fentanyl 100 mcg. given IV. Versed 10 mg. given IV.  Findings - NORMAL EXAM: Cecum to Rectum.  NORMAL EXAM: Cecum.  NORMAL EXAM: Rectum.   Assessment Normal examination.  Events  Unplanned Interventions: No intervention was required.  Unplanned Events: There were no complications. Plans  Post Exam Instructions: Post sedation instructions given.  Patient Education: Patient given standard instructions for: a normal exam.  Disposition: After procedure patient sent to recovery. After recovery patient sent home.  Scheduling/Referral: Colonoscopy, to Barbette Hair.  Arlyce Dice, MD, around Apr 24, 2011.    cc: Lisabeth Pick, MD  This report was created from the original endoscopy report, which was reviewed and signed by the above listed endoscopist.

## 2010-03-13 NOTE — Letter (Signed)
Summary: Handout Printed  Printed Handout:  - Back Pain & Injury 

## 2010-03-13 NOTE — Progress Notes (Signed)
----   Converted from flag ---- ---- 09/18/2009 12:12 PM, Hannah Beat MD wrote: FLP: v77.91 BMP: v77.1 Hepatic Function Panel: v58.69 CBC with diff: 780.79 TSH: 780.79   ---- 09/18/2009 10:30 AM, Liane Comber CMA (AAMA) wrote: Ermalene Searing pt is came for cpx labs today, what labs to draw and dx codes? Thanks Tasha ------------------------------

## 2010-03-13 NOTE — Assessment & Plan Note (Signed)
Summary: ABD PAIN,DIARRHEA,WEIGHT LOSS,   Vital Signs:  Patient profile:   45 year old female Height:      66 inches Weight:      148.0 pounds BMI:     23.97 Temp:     98.7 degrees F oral Pulse rate:   86 / minute Pulse rhythm:   regular BP sitting:   100 / 60  (left arm) Cuff size:   regular  Vitals Entered By: Benny Lennert CMA Duncan Dull) (December 12, 2009 9:08 AM)  History of Present Illness: Chief complaint abdominal pain,diarrhea, and weight loss   In last 2 months..decreased appetite, 25 lb weihgt loss, mild nausea.  Abdominal pain ... significantly worse with eating. Greatest in epigastrum and radiates to left upper abdomen. Dull ache..not sharp. No low abdominal pain, no abdominal bloating, no gas, no GERD. Having expolsive watery stools....20-30 minutes prior to eating.  Does not feel any better No history of other significant Gi issues.  Seen 10/26 at   [facility]       by Dr. Verda Cumins on nexium, plain X-ray .Marland KitchenMarland KitchenCXR and abd films: negative.  HAs been on nexium for four days..no cahnge in abdominal pain.  No recent travel, no camping, no recent antibiotics. Has had some increase in stress lately.  She works around people with Cdiff every day...RN.  No change in foods she eats except decreased and eating bland foods.  Personal history of...PUD years ago. Father with Chron's disease.  Last colonoscopy 1 -2 years ago. GI MD is Dr. Arlyce Dice.   NML cbc and thyroid in 09/2009.  Problems Prior to Update: 1)  Gerd  (ICD-530.81) 2)  Hx of Acut Peptc Ulcr Uns Site W/hem w/o Mention Obst  (ICD-533.00) 3)  Weight Loss, Recent  (ICD-783.21) 4)  Abdominal Pain, Generalized  (ICD-789.07) 5)  Hyperlipidemia  (ICD-272.4) 6)  Basal Cell Carcinoma, Hx of  (ICD-V10.83) 7)  Other Screening Mammogram  (ICD-V76.12) 8)  Encounter For Long-term Use of Other Medications  (ICD-V58.69) 9)  Screening For Diabetes Mellitus  (ICD-V77.1) 10)  Screening For Lipoid Disorders   (ICD-V77.91) 11)  Routine Gynecological Examination  (ICD-V72.31) 12)  Preventive Health Care  (ICD-V70.0) 13)  Dysplastic Nevus  (ICD-216.9) 14)  Allergic Rhinitis  (ICD-477.9) 15)  Insomnia, Chronic  (ICD-307.42) 16)  Sinusitis, Chronic  (ICD-473.9) 17)  Hx, Family, Malignancy, Gi Tract  (ICD-V16.0) 18)  Pap Smear, Abnormal  (ICD-795.00) 19)  Anxiety State Nos  (ICD-300.00) 20)  Chest Pain  (ICD-786.50)  Current Medications (verified): 1)  Fish Oil 1000 Mg Caps (Omega-3 Fatty Acids) .... Take 1 Capsule By Mouth Two Times A Day 2)  Alprazolam 0.25 Mg  Tabs (Alprazolam) .Marland Kitchen.. 1-2 Tab By Mouth Daily As Needed Anxiety 3)  Vitamin C 500 Mg  Tabs (Ascorbic Acid) .... Take 1 Tablet By Mouth Once A Day 4)  Fluticasone Propionate 50 Mcg/act  Susp (Fluticasone Propionate) .... 2 Sprays Per Nostril Daily 5)  Proair Hfa 108 (90 Base) Mcg/act  Aers (Albuterol Sulfate) .... 2 Pufffs Inh Q 4 Hours As Needed Wheeze 6)  Zolpidem Tartrate 10 Mg Tabs (Zolpidem Tartrate) .Marland Kitchen.. 1 Tab By Mouth At Bedtime As Needed Insomnia 7)  Glucosamine 500 Mg Caps (Glucosamine Sulfate) .... 2 Tabs Daily 8)  Red Yeast Rice 600 Mg Tabs (Red Yeast Rice Extract) .... 2 Tabs Daily 9)  Nexium 40 Mg Cpdr (Esomeprazole Magnesium) .... Take 1 Tablet Daily  Allergies (verified): No Known Drug Allergies  Past History:  Past  medical, surgical, family and social histories (including risk factors) reviewed, and no changes noted (except as noted below).  Past Medical History: Reviewed history from 12/06/2009 and no changes required. Hyperlipidemia anxiety disorder-chronic Chronic insomnia Allergic rhinitis History of an ulcer  Past Surgical History: Reviewed history from 11/13/2009 and no changes required. 1998    Ectopic pregnancy - right ovary/tube removed 2000    Nasal surgery             miscarriages x 2  Family History: Reviewed history from 10/29/2006 and no changes required. Father: Died 51 colon CA,  seizures Mother: Died 43 colon CA Siblings: 1 brother, DM, HTN, high cholesterol, TIA No MI < 65 DM:  MGF Colon CA:  Mom - age 41                   Dad - age 81  Social History: Reviewed history from 11/13/2009 and no changes required. 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  Denies depression.  General:  Complains of fatigue. CV:  Denies chest pain or discomfort. Resp:  Denies shortness of breath. GI:  Denies bloody stools and constipation. GU:  Denies abnormal vaginal bleeding and dysuria.  Physical Exam  General:  Well-developed,well-nourished,in no acute distress; alert,appropriate and cooperative throughout examination Mouth:  Oral mucosa and oropharynx without lesions or exudates.  Teeth in good repair. Neck:  no carotid bruit or thyromegaly no cervical or supraclavicular lymphadenopathy  Lungs:  Normal respiratory effort, chest expands symmetrically. Lungs are clear to auscultation, no crackles or wheezes. Heart:  Normal rate and regular rhythm. S1 and S2 normal without gallop, murmur, click, rub or other extra sounds. Abdomen:  ttp superior to umbilicus, B lower quadrant ttp, no rebound, no guarding.  Pulses:  R and L posterior tibial pulses are full and equal bilaterally  Extremities:  no edema Skin:  Intact without suspicious lesions or rashes Psych:  Cognition and judgment appear intact. Alert and cooperative with normal attention span and concentration. No apparent delusions, illusions, hallucinations   Impression & Recommendations:  Problem # 1:  ABDOMINAL PAIN, GENERALIZED (ICD-789.07) ? two issues going on epigastric symptoms and lower abd tenderness and diarrhea.... Diarrhea, weight loss...concerning for  Crohn's given family history.  May also be stress induced gastritis and IBS...but will start with eval of labs. LAst cbc and TSH in 09/2009 nml...will not repeat. Work on stress reduction, healthy eating, continue nexium, increase fiber in diet. She will return to GI for further recs/eval.   Orders: TLB-Hepatic/Liver Function Pnl (80076-HEPATIC) TLB-BMP (Basic Metabolic Panel-BMET) (80048-METABOL) TLB-Lipase (83690-LIPASE)  Problem # 2:  WEIGHT LOSS, RECENT (ICD-783.21)  Problem # 3:  DIARRHEA (ICD-787.91)  Eval for infectious source.   Orders: T-Stool Giardia / Crypto- EIA (84132) T-Culture, C-Diff Toxin A/B ([id number]) T- * Misc. Laboratory test [id number]) T-Culture, Stool ([id number])  Complete Medication List: 1)  Fish Oil 1000 Mg Caps (Omega-3 fatty acids) .... Take 1 capsule by mouth two times a day 2)  Alprazolam 0.25 Mg Tabs (Alprazolam) .Marland Kitchen.. 1-2 tab by mouth daily as needed anxiety 3)  Vitamin C 500 Mg Tabs (Ascorbic acid) .... Take 1 tablet by mouth once a day 4)  Fluticasone Propionate 50 Mcg/act Susp (Fluticasone propionate) .... 2 sprays per nostril  daily 5)  Proair Hfa 108 (90 Base) Mcg/act Aers (Albuterol sulfate) .... 2 pufffs inh q 4 hours as needed wheeze 6)  Zolpidem Tartrate 10 Mg Tabs (Zolpidem tartrate) .Marland Kitchen.. 1 tab by mouth at bedtime as needed insomnia 7)  Glucosamine 500 Mg Caps (Glucosamine sulfate) .... 2 tabs daily 8)  Red Yeast Rice 600 Mg Tabs (Red yeast rice extract) .... 2 tabs daily 9)  Nexium 40 Mg Cpdr (Esomeprazole magnesium) .... Take 1 tablet daily   Orders Added: 1)  Est. Patient Level IV [[address]244] 2)  TLB-Hepatic/Liver Function Pnl [80076-HEPATIC] 3)  TLB-BMP (Basic Metabolic Panel-BMET) [80048-METABOL] 4)  TLB-Lipase [83690-LIPASE] 5)  T-Stool Giardia / Crypto- EIA [01027] 6)  T-Culture, C-Diff Toxin A/B [[id number]] 7)  T- * Misc. Laboratory test [phone] 8)  T-Culture, Stool [[id number]]    Current Allergies (reviewed  today): No known allergies

## 2010-03-13 NOTE — Progress Notes (Signed)
Summary: alprazolam  Phone Note Refill Request   Refills Requested: Medication #1:  ALPRAZOLAM 0.25 MG  TABS 1-2 tab by mouth daily as needed anxiety [facility].  Initial call taken by: Melody Comas,  December 05, 2009 1:35 PM  Follow-up for Phone Call        Rx called to pharmacy Follow-up by: Benny Lennert CMA Duncan Dull),  December 05, 2009 2:23 PM    Prescriptions: ALPRAZOLAM 0.25 MG  TABS (ALPRAZOLAM) 1-2 tab by mouth daily as needed anxiety  #30 x 0   Entered and Authorized by:   Kerby Nora MD   Signed by:   Kerby Nora MD on 12/05/2009   Method used:   Telephoned to ...       [facility]* (retail)       [address]       [address] Shipping/mailing       [place], [place]  [address]       Ph: [phone]       Fax: [phone]   RxID:   [id number]

## 2010-03-13 NOTE — Progress Notes (Signed)
Summary: Rx Zolpidem  Phone Note Refill Request Call back at [phone] Message from:  [facility] on May 03, 2009 11:25 AM  Refills Requested: Medication #1:  ZOLPIDEM TARTRATE 10 MG TABS 1 tab by mouth at bedtime as needed insomnia   Last Refilled: 03/31/2009 Received faxed refill request, please advise   Method Requested: Telephone to Pharmacy Initial call taken by: Linde Gillis CMA Duncan Dull),  May 03, 2009 11:26 AM  Follow-up for Phone Call        Rx called to pharmacy Follow-up by: Benny Lennert CMA Duncan Dull),  May 03, 2009 12:00 PM    Prescriptions: ZOLPIDEM TARTRATE 10 MG TABS (ZOLPIDEM TARTRATE) 1 tab by mouth at bedtime as needed insomnia  #30 x 0   Entered and Authorized by:   Kerby Nora MD   Signed by:   Kerby Nora MD on 05/03/2009   Method used:   Telephoned to ...        [facility]. (retail)       [address]       [place], [place]  [id number]       Ph: [phone]       Fax: [phone]   RxID:   [id number]

## 2010-03-13 NOTE — Letter (Signed)
Summary: New Patient letter  [facility]  [address] [place], [place] [address]   Phone: [phone]  Fax: [phone]       12/12/2009 MRN: [id number]  [facility] [address] [place], [place]  [address]  Dear Ms. Blatt,  Welcome to the Gastroenterology Division at [facility].    You are scheduled to see Dr.  Arlyce Dice on 01-19-10 at 3pm on the 3rd floor at [facility], [address]  We ask that you try to arrive at our office 15 minutes prior to your appointment time to allow for check-in.  We would like you to complete the enclosed self-administered evaluation form prior to your visit and bring it with you on the day of your appointment.  We will review it with you.  Also, please bring a complete list of all your medications or, if you prefer, bring the medication bottles and we will list them.  Please bring your insurance card so that we may make a copy of it.  If your insurance requires a referral to see a specialist, please bring your referral form from your primary care physician.  Co-payments are due at the time of your visit and may be paid by cash, check or credit card.     Your office visit will consist of a consult with your physician (includes a physical exam), any laboratory testing he/she may order, scheduling of any necessary diagnostic testing (e.g. x-ray, ultrasound, CT-scan), and scheduling of a procedure (e.g. Endoscopy, Colonoscopy) if required.  Please allow enough time on your schedule to allow for any/all of these possibilities.    If you cannot keep your appointment, please call [phone] to cancel or reschedule prior to your appointment date.  This allows [place] the opportunity to schedule an appointment for another patient in need of care.  If you do not cancel or reschedule by 5 p.m. the business day prior to your appointment date, you will be charged a $50.00 late cancellation/no-show fee.    Thank you for choosing [facility] for your medical needs.  We appreciate the opportunity to care for you.  Please visit [place] at our website  to learn more about our practice.                     Sincerely,                                                             The Gastroenterology Division

## 2010-03-13 NOTE — Progress Notes (Signed)
Summary: refill request for ambien  Phone Note Refill Request Message from:  Fax from Pharmacy  Refills Requested: Medication #1:  ZOLPIDEM TARTRATE 10 MG TABS 1 tab by mouth at bedtime as needed insomnia   Last Refilled: 06/20/2009 Faxed request from [facility].  [phone].  Initial call taken by: Lowella Petties CMA,  September 19, 2009 3:39 PM  Follow-up for Phone Call        Rx called to pharmacy Follow-up by: Benny Lennert CMA Duncan Dull),  September 19, 2009 4:15 PM

## 2010-03-13 NOTE — Miscellaneous (Signed)
Summary: [facility] EGD  Clinical Lists Changes  Orders: Added new Test order of EGD (EGD) - Signed 

## 2010-03-13 NOTE — Letter (Signed)
Summary: EGD Instructions  [facility]  [address] [place], [place] [address]   Phone: [phone]  Fax: [phone]       Casey Scott    09-20-1965    MRN: [id number]       Procedure Day /Date: 02-14-2010     Arrival Time: 9:00 Am      Procedure Time:10:00 AM     Location of Procedure:                    X     [facility] (4th Floor)   PREPARATION FOR ENDOSCOPY   On1-4-2012THE DAY OF THE PROCEDURE:  1.   No solid foods, milk or milk products are allowed after midnight the night before your procedure.  2.   Do not drink anything colored red or purple.  Avoid juices with pulp.  No orange juice.  3.  You may drink clear liquids until 8:00 AM , which is 2 hours before your procedure.                                                                                                CLEAR LIQUIDS INCLUDE: Water Jello Ice Popsicles Tea (sugar ok, no milk/cream) Powdered fruit flavored drinks Coffee (sugar ok, no milk/cream) Gatorade Juice: apple, white grape, white cranberry  Lemonade Clear bullion, consomm, broth Carbonated beverages (any kind) Strained chicken noodle soup Hard Candy   MEDICATION INSTRUCTIONS  Unless otherwise instructed, you should take regular prescription medications with a small sip of water as early as possible the morning of your procedure.        OTHER INSTRUCTIONS  You will need a responsible adult at least 45 years of age to accompany you and drive you home.   This person must remain in the waiting room during your procedure.  Wear loose fitting clothing that is easily removed.  Leave jewelry and other valuables at home.  However, you may wish to bring a book to read or an iPod/MP3 player to listen to music as you wait for your procedure to start.  Remove all body piercing jewelry and leave at home.  Total time from sign-in until discharge is approximately 2-3 hours.  You should go home directly after  your procedure and rest.  You can resume normal activities the day after your procedure.  The day of your procedure you should not:   Drive   Make legal decisions   Operate machinery   Drink alcohol   Return to work  You will receive specific instructions about eating, activities and medications before you leave.    The above instructions have been reviewed and explained to me by   _______________________    I fully understand and can verbalize these instructions _____________________________ Date _________

## 2010-03-13 NOTE — Progress Notes (Signed)
Summary: Casey Scott  Phone Note Refill Request Message from:  Scriptline on December 14, 2009 10:27 AM  Refills Requested: Medication #1:  ZOLPIDEM TARTRATE 10 MG TABS 1 tab by mouth at bedtime as needed insomnia   Supply Requested: 1 month [facility] outpatient   Method Requested: Telephone to Pharmacy Initial call taken by: Benny Lennert CMA Duncan Dull),  December 14, 2009 10:27 AM  Follow-up for Phone Call        Correction..no refill of alprazolam..instead call in zolpidem.  Follow-up by: Kerby Nora MD,  December 15, 2009 1:10 PM  Additional Follow-up for Phone Call Additional follow up Details #1::        Rx for Zolpidem called to pharmacy. Additional Follow-up by: Linde Gillis CMA Duncan Dull),  December 15, 2009 2:08 PM    Prescriptions: ZOLPIDEM TARTRATE 10 MG TABS (ZOLPIDEM TARTRATE) 1 tab by mouth at bedtime as needed insomnia  #90 x 0   Entered and Authorized by:   Kerby Nora MD   Signed by:   Kerby Nora MD on 12/15/2009   Method used:   Telephoned to ...       [facility]* (retail)       [address]       [address] Shipping/mailing       [place], [place]  [address]       Ph: [phone]       Fax: [phone]   RxID:   [id number] ALPRAZOLAM 0.25 MG  TABS (ALPRAZOLAM) 1-2 tab by mouth daily as needed anxiety  #30 x 0   Entered and Authorized by:   Kerby Nora MD   Signed by:   Kerby Nora MD on 12/15/2009   Method used:   Telephoned to ...       [facility]* (retail)       [address]       [address] Shipping/mailing       [place], [place]  [address]       Ph: [phone]       Fax: [phone]   RxID:   [id number]

## 2010-03-13 NOTE — Progress Notes (Signed)
Summary: Rx Ambien  Phone Note Refill Request Call back at [phone] cell Message from:  Patient on Jun 20, 2009 10:29 AM  Refills Requested: Medication #1:  ZOLPIDEM TARTRATE 10 MG TABS 1 tab by mouth at bedtime as needed insomnia   Supply Requested: 3 months Patient is requesting a new Rx for Ambien to sent to her pharmacy with a 3 month supply.  She says she can get a 3 month supply for the same price as a 30 day supply.  Please send Rx to [facility].   Method Requested: Fax to Local Pharmacy Initial call taken by: Linde Gillis CMA Duncan Dull),  Jun 20, 2009 10:30 AM  Follow-up for Phone Call        Rx called to pharmacy, patient notified via message on cell phone voicemail. Follow-up by: Linde Gillis CMA Duncan Dull),  Jun 20, 2009 1:35 PM    Prescriptions: ZOLPIDEM TARTRATE 10 MG TABS (ZOLPIDEM TARTRATE) 1 tab by mouth at bedtime as needed insomnia  #90 x 0   Entered and Authorized by:   Kerby Nora MD   Signed by:   Kerby Nora MD on 06/20/2009   Method used:   Telephoned to ...       [facility]* (retail)       [address]       [address] Shipping/mailing       [place], [place]  [address]       Ph: [phone]       Fax: [phone]   RxID:   [id number]

## 2010-03-13 NOTE — Assessment & Plan Note (Signed)
Summary: 55M OF SIGNIFICANT WEIGHT LOSS/DECREASED APPETITE/ABD PAIN,DIA...   History of Present Illness Visit Type: consult Primary GI MD: Melvia Heaps MD [facility] Primary Provider: Kerby Nora, MD Chief Complaint: diarrhea, epigastri/LUQ pain-neg stool studies History of Present Illness:   PLEASANT 45 YO FEMALE KNOWN TO DR. KAPLAN FROM SCREENING COLONOSCOPY IN 3/08. THIS WAS NORMAL. SHE HAS FAMILY HX OF COLON CANCER IN BOTH HER MOTHER AND FATHER(MOTHER DECEASED IN HER 40'S). FATHER ALSO WITH HX OF CROHNS.  PT COMES IN NOW WITH C/O 2 MONTH  HX OF POST PRANDIAL UPPER ABDOMINAL PAIN-LOCATED EPIGASTRIC AND LUQ. SHE DESCRIBES IT AS COLICKY OR CRAMPY. SHE  ALSO GETS NAUSEATED, AND HAS HAD INTERMITTENT VOMITING. NO FEVER/CHILLS. SHE HAD DIARRHEA FOR 3-4 WEEKS WHICH HAS NOW IMPROVED TO OCCASIONAL LOOSE STOOLS. NO MELENA OR HEME. APPETITE IS DECREASED, WEIGHT IS DOWN FROM 168-145.SHE C/O  FATIGUE. LABS -CMET UNREMARKABLE, STOOL CULTURES ALL NEGATIVE,STOOL LACTOFERRIN POSITIVE. PLAIN ABDOMINAL FILMS NEGATIVE.  TRIAL OF NEXIUM 40 MG -NO BENEFIT AS YET.   GI Review of Systems    Reports abdominal pain, loss of appetite, nausea, vomiting, and  weight loss.     Location of  Abdominal pain: epigastric area, LUQ.    Denies acid reflux, belching, bloating, chest pain, dysphagia with liquids, dysphagia with solids, heartburn, vomiting blood, and  weight gain.      Reports diarrhea.     Denies anal fissure, black tarry stools, change in bowel habit, constipation, diverticulosis, fecal incontinence, heme positive stool, hemorrhoids, irritable bowel syndrome, jaundice, light color stool, liver problems, rectal bleeding, and  rectal pain.    Current Medications (verified): 1)  Fish Oil 1000 Mg Caps (Omega-3 Fatty Acids) .... Take 1 Capsule By Mouth Two Times A Day 2)  Alprazolam 0.25 Mg  Tabs (Alprazolam) .Marland Kitchen.. 1-2 Tab By Mouth Daily As Needed Anxiety 3)  Vitamin C 500 Mg  Tabs (Ascorbic Acid) .... Take 1 Tablet  By Mouth Once A Day 4)  Fluticasone Propionate 50 Mcg/act  Susp (Fluticasone Propionate) .... 2 Sprays Per Nostril Daily 5)  Proair Hfa 108 (90 Base) Mcg/act  Aers (Albuterol Sulfate) .... 2 Pufffs Inh Q 4 Hours As Needed Wheeze 6)  Zolpidem Tartrate 10 Mg Tabs (Zolpidem Tartrate) .Marland Kitchen.. 1 Tab By Mouth At Bedtime As Needed Insomnia 7)  Glucosamine 500 Mg Caps (Glucosamine Sulfate) .... 2 Tabs Daily 8)  Red Yeast Rice 600 Mg Tabs (Red Yeast Rice Extract) .... 2 Tabs Daily 9)  Nexium 40 Mg Cpdr (Esomeprazole Magnesium) .... Take 1 Tablet Daily  Allergies (verified): No Known Drug Allergies  Past History:  Past Medical History: Hyperlipidemia anxiety disorder-chronic Chronic insomnia Allergic rhinitis POSITIVE FAMILY HX OF COLON CANCER-MOTHER/FATHER Basal Cell Carcinoma  Past Surgical History: 1998    Ectopic pregnancy - right ovary/tube removed 2000    Nasal surgery             miscarriages x 2 3/08-COLONOSCOPY-NORMAL(KAPLAN)  Family History: Father: Died 62 colon CA, seizures, colon polyps Mother: Died 29 colon CA Siblings: 1 brother, DM, HTN, high cholesterol, TIA No MI < 20 DM:  MGF Colon CA:  Mom - age 66                   Dad - age 62 Family History of Colitis/Crohn's: Father  Social History: Marital Status: Single Children: 1, healthy Occupation: Charity fundraiser at [employer] Never Smoked Alcohol use-yes, socially, 2 drinks/month Drug use-no Regular exercise-yes, 5 x/ week Diet:  fruit/veggies, (+) H2O Recently moved to  [place] area Daily Caffeine Use 2 cups coffee in AM  Review of Systems       The patient complains of allergy/sinus, fatigue, and night sweats.  The patient denies anemia, anxiety-new, arthritis/joint pain, back pain, blood in urine, breast changes/lumps, confusion, cough, coughing up blood, depression-new, fainting, fever, headaches-new, hearing problems, heart murmur, heart rhythm changes, itching, menstrual pain, muscle pains/cramps, nosebleeds, pregnancy  symptoms, shortness of breath, skin rash, sleeping problems, sore throat, swelling of feet/legs, swollen lymph glands, thirst - excessive, urination - excessive, urination changes/pain, urine leakage, vision changes, and voice change.         SEE HPI  Vital Signs:  Patient profile:   45 year old female Height:      66 inches Weight:      145 pounds BMI:     23.49 Pulse rate:   68 / minute Pulse rhythm:   regular BP sitting:   90 / 66  (left arm) Cuff size:   regular  Vitals Entered By: Francee Piccolo CMA Duncan Dull) (December 19, 2009 2:12 PM)  Physical Exam  General:  Well developed, well nourished, no acute distress. Head:  Normocephalic and atraumatic. Eyes:  PERRLA, no icterus. Lungs:  Clear throughout to auscultation. Heart:  Regular rate and rhythm; no murmurs, rubs,  or bruits. Abdomen:  SOFT, TENDER MID ABDOMEN,LMQ/LUQ, NO GUARDING, NO REBOUND, NO MASS OR HSM, BS ACTIVE Rectal:  HEME NEGATIVE BROWN STOOL Extremities:  No clubbing, cyanosis, edema or deformities noted. Neurologic:  Alert and  oriented x4;  grossly normal neurologically. Psych:  Alert and cooperative. Normal mood and affect.   Impression & Recommendations:  Problem # 1:  ABDOMINAL PAIN-MULTIPLE SITES (ICD-789.09) Assessment New 45 YO FEMALE WITH 2 MONTH HX OF POST PRANDIAL ABDOMINAL PAIN,UPPER ABDOMEN,LUQ-WITH NAUSEA,DECREASED APPETITE ,CHANGE IN BOWEL HABITS WITH DIARRHEA ,AND WEIGHT LOSS OF  23 POUNDS. ETIOLOGY NOT CLEAR. R/O IBD,GB DISEASE, CELIAC,.  LABS AS BELOW CONTINUE NEXIUM 40 MG DAILY FOR NOW ADD TRIAL OF ROBINUL FORTE 2 MG once daily-BID SCHEDULE FOR CT ABDOMEN /PELVIS WITH CONTRAST FURTHER WORKUP PENDING ABOVE  Problem # 2:  HX, FAMILY, MALIGNANCY, GI TRACT (ICD-V16.0) Assessment: Comment Only MOTHER DECEASED COLON CANCER IN HER 40'S  LAST COLON 3/08- REPEAT SCREENING  Q 5 YEARS  Problem # 3:  ANXIETY STATE NOS (ICD-300.00) Assessment: Comment Only  Other Orders: TLB-CRP-High  Sensitivity (C-Reactive Protein) (86140-FCRP) TLB-Sedimentation Rate (ESR) (85652-ESR) T-Tissue Transglutamase Ab IgA (70623-76283) T-Pregnancy Test, Urine, Qual (15176) CT Abdomen/Pelvis with Contrast (CT Abd/Pelvis w/con)  Patient Instructions: 1)  Please go to lab, basement level. 2)  We scheduled the CT scan at [facility]. 3)  Directions and contrast provided. 4)  We sent a prescription to your pharmacy, [facility]. 5)  Copy sent to : Kerby Nora, MD 6)  We will call you with your results. 7)  The medication list was reviewed and reconciled.  All changed / newly prescribed medications were explained.  A complete medication list was provided to the patient / caregiver. Prescriptions: ROBINUL-FORTE 2 MG TABS (GLYCOPYRROLATE) Take 1 tab every morning and may take 2md dose if needed each day  #60 x 2   Entered by:   Lowry Ram NCMA   Authorized by:   Sammuel Cooper PA-c   Signed by:   Lowry Ram NCMA on 12/19/2009   Method used:   Electronically to        [facility]* (retail)       [address]       [address] Shipping/mailing       [place], [place]  [address]       Ph: [phone]       Fax: [phone]   RxID:   [id number]

## 2010-03-13 NOTE — Progress Notes (Signed)
Summary: ZOLPIDEM TARTRATE 10 MG TABS  Phone Note Refill Request Call back at Home Phone [phone] Message from:  Patient on February 20, 2009 2:20 PM  Refills Requested: Medication #1:  ZOLPIDEM TARTRATE 10 MG TABS 1 tab by mouth at bedtime as needed insomnia [facility]   Initial call taken by: Melody Comas,  February 20, 2009 2:22 PM  Follow-up for Phone Call        rx called to pharmacy Follow-up by: Benny Lennert CMA Duncan Dull),  February 20, 2009 4:12 PM    Prescriptions: ZOLPIDEM TARTRATE 10 MG TABS (ZOLPIDEM TARTRATE) 1 tab by mouth at bedtime as needed insomnia  #30 x 0   Entered and Authorized by:   Kerby Nora MD   Signed by:   Kerby Nora MD on 02/20/2009   Method used:   Telephoned to ...       [facility]* (retail)       [address]       [address] Shipping/mailing       [place], [place]  [address]       Ph: [phone]       Fax: [phone]   RxID:   [id number]

## 2010-03-13 NOTE — Letter (Signed)
Summary: Results Follow up Letter  [place] [facility]  [address] [place], [place] [address]   Phone: [phone]  Fax: [phone]    09/29/2009 MRN: [id number]     [facility] [address] [place], [place]  [address]    Dear Ms. Branagan,  The following are the results of your recent test(s):  Test         Result    Pap Smear:        Normal ___x__  Not Normal _____ Comments:Repeat in 1 year ______________________________________________________ Cholesterol: LDL(Bad cholesterol):         Your goal is less than:         HDL (Good cholesterol):       Your goal is more than: Comments:  ______________________________________________________ Mammogram:        Normal _____  Not Normal _____ Comments:  ___________________________________________________________________ Hemoccult:        Normal _____  Not normal _______ Comments:    _____________________________________________________________________ Other Tests:    We routinely do not discuss normal results over the telephone.  If you desire a copy of the results, or you have any questions about this information we can discuss them at your next office visit.   Sincerely,  Kerby Nora MD

## 2010-03-13 NOTE — Progress Notes (Signed)
Summary: Rx refill  Phone Note Refill Request Call back at [phone] Message from:  Patient on March 31, 2009 10:55 AM  Refills Requested: Medication #1:  ALPRAZOLAM 0.25 MG  TABS 1-2 tab by mouth daily as needed anxiety  Medication #2:  ZOLPIDEM TARTRATE 10 MG TABS 1 tab by mouth at bedtime as needed insomnia Patient called and request Rx refill, please advise   Method Requested: Telephone to Pharmacy Initial call taken by: Linde Gillis CMA Duncan Dull),  March 31, 2009 11:01 AM  Follow-up for Phone Call        Rx called to pharmacy Follow-up by: Benny Lennert CMA Duncan Dull),  March 31, 2009 11:12 AM    Prescriptions: ZOLPIDEM TARTRATE 10 MG TABS (ZOLPIDEM TARTRATE) 1 tab by mouth at bedtime as needed insomnia  #30 x 0   Entered and Authorized by:   Kerby Nora MD   Signed by:   Kerby Nora MD on 03/31/2009   Method used:   Telephoned to ...       [facility] (retail)       [address]       [place], [place]  [id number]       Ph: [phone]       Fax: [phone]   RxID:   [id number] ALPRAZOLAM 0.25 MG  TABS (ALPRAZOLAM) 1-2 tab by mouth daily as needed anxiety  #30 x 0   Entered and Authorized by:   Kerby Nora MD   Signed by:   Kerby Nora MD on 03/31/2009   Method used:   Telephoned to ...       [facility] (retail)       [address]       [place], [place]  [address]       Ph: [phone]       Fax: [phone]   RxID:   [id number]   Appended Document: Rx refill Rx called to [facility] per patient's request, Rx cancelled at [facility] in 2008.

## 2010-03-13 NOTE — Miscellaneous (Signed)
Summary: Celiac Panel Labs  Clinical Lists Changes  Problems: Added new problem of NAUSEA WITH VOMITING (ICD-787.01) Orders: Added new Test order of T-Sprue Panel (Celiac Disease [facility] Eval) (83516x3/[address]) - Signed

## 2010-03-15 NOTE — Progress Notes (Signed)
Summary: xanax  Phone Note Refill Request Message from:  Scriptline on March 01, 2010 2:44 PM  Refills Requested: Medication #1:  ALPRAZOLAM 0.25 MG  TABS 1-2 tab by mouth daily as needed anxiety   Supply Requested: 1 month [facility] out patient pharmacy   Method Requested: Telephone to Pharmacy Initial call taken by: Benny Lennert CMA Duncan Dull),  March 01, 2010 2:44 PM  Follow-up for Phone Call        Rx called to pharmacy Follow-up by: Benny Lennert CMA Duncan Dull),  March 02, 2010 10:43 AM    Prescriptions: ALPRAZOLAM 0.25 MG  TABS (ALPRAZOLAM) 1-2 tab by mouth daily as needed anxiety  #30 x 0   Entered and Authorized by:   Kerby Nora MD   Signed by:   Kerby Nora MD on 03/02/2010   Method used:   Telephoned to ...       [facility] (retail)       [address]       [place], [place]  [address]       Ph: [phone]       Fax: [phone]   RxID:   [id number]

## 2010-03-15 NOTE — Progress Notes (Signed)
Summary: Casey Scott  Phone Note Refill Request Message from:  Scriptline on March 08, 2010 2:00 PM  Refills Requested: Medication #1:  ZOLPIDEM TARTRATE 10 MG TABS 1 tab by mouth at bedtime as needed insomnia   Supply Requested: 1 month [place] out patient pharmacy   Method Requested: Telephone to Pharmacy Initial call taken by: Benny Lennert CMA Duncan Dull),  March 08, 2010 2:00 PM  Follow-up for Phone Call        Rx called to pharmacy([facility] outpatient pharmacy Follow-up by: Benny Lennert CMA Duncan Dull),  March 09, 2010 2:44 PM    Prescriptions: ZOLPIDEM TARTRATE 10 MG TABS (ZOLPIDEM TARTRATE) 1 tab by mouth at bedtime as needed insomnia  #90 x 0   Entered and Authorized by:   Kerby Nora MD   Signed by:   Kerby Nora MD on 03/09/2010   Method used:   Telephoned to ...       [facility] (retail)       [address]       [place], [place]  [address]       Ph: [phone]       Fax: [phone]   RxID:   [id number]

## 2010-03-15 NOTE — Progress Notes (Signed)
Summary: speak to nurse   Phone Note Call from Patient Call back at Home Phone [phone]   Caller: Patient Call For: Dr Arlyce Dice Reason for Call: Talk to Nurse Summary of Call: Patient wants to know if she should keep the appt scheduled for 1-26, states that she received a letter stating to keep up with what was discuss but she doesn't remember what was said after procedure. Initial call taken by: Tawni Levy,  February 26, 2010 4:26 PM  Follow-up for Phone Call        Left message to call back Follow-up by: Selinda Michaels RN,  February 27, 2010 9:23 AM  Additional Follow-up for Phone Call Additional follow up Details #1::        Lmom for patient to call back. Graciella Freer RN  February 28, 2010 9:09 AM     Additional Follow-up for Phone Call Additional follow up Details #2::    Spoke with patient who stated confusion with the post EGD letter and whether she should keep her F/U appointment on 03/08/10. Patient is still having the LUQ pain as before- pain is anytime and doesn't relate to eating, etc. She reports pain last pm that woke her up from sleeping. She was started on Nexium and Robinol Forte and neither has helped the pain. On her EGD it was noted she has an " irregular Z-line" and she doesn't think she has Barrett's Esophagus. Dr Arlyce Dice, does patient need to stop the Nexium and Robinul Forte? She doesn't know what she is supposed to be doing per the letter or will you discuss this in the OV?  Lance Morin RN  March 01, 2010 9:31 AM   Additional Follow-up for Phone Call Additional follow up Details #3:: Details for Additional Follow-up Action Taken: She needs OV.  OK to d/c nexium.  d/c robinul if it is not helping  Notified patient Dr Arlyce Dice will explain her situation at the visit on 03/08/10. Also informed her she may stop the Nexium and Robinol if they aren't helping. Patient stated understanding. Graciella Freer RN  March 01, 2010 11:00 AM  Additional  Follow-up by: Louis Meckel MD,  March 01, 2010 10:37 AM

## 2010-03-15 NOTE — Procedures (Signed)
Summary: Upper Endoscopy  Patient: Casey Scott Note: All result statuses are Final unless otherwise noted.  Tests: (1) Upper Endoscopy (EGD)   EGD Upper Endoscopy       DONE     [facility]     [address]     [place], [place]  [address]           ENDOSCOPY PROCEDURE REPORT           PATIENT:  Casey Scott, Casey Scott  MR#:  [id number]     BIRTHDATE:  1965-07-21, 44 yrs. old  GENDER:  female           ENDOSCOPIST:  Barbette Hair. Arlyce Dice, MD     Referred by:           PROCEDURE DATE:  02/14/2010     PROCEDURE:  EGD with biopsy, [address]     ASA CLASS:  Class I     INDICATIONS:  small bowel biopsy to rule out celiac sprue           MEDICATIONS:   Fentanyl 100 mcg IV, Versed 10 mg IV,     glycopyrrolate (Robinal) 0.2 mg IV, 0.6cc simethancone 0.6 cc PO     TOPICAL ANESTHETIC:  Cetacaine Spray           DESCRIPTION OF PROCEDURE:   After the risks benefits and     alternatives of the procedure were thoroughly explained, informed     consent was obtained.  The LB GIF-H180 [id number] endoscope was     introduced through the mouth and advanced to the third portion of     the duodenum, without limitations.  The instrument was slowly     withdrawn as the mucosa was fully examined.     <<PROCEDUREIMAGES>>           irregular Z-line at the gastroesophageal junction (see image3).     Bxs were taken to r/o Barrett's esophagus  Normal duodenal folds     were noted (see image2). Bxs taken to r/o Celiac disease     Otherwise the examination was normal.    Retroflexed views revealed     no abnormalities.    The scope was then withdrawn from the patient     and the procedure completed.           COMPLICATIONS:  None           ENDOSCOPIC IMPRESSION:     1) Irregular Z-line at the gastroesophageal junction     2) Normal duodenal folds     3) Otherwise normal examination     RECOMMENDATIONS:     1) Await biopsy results     2) Call office next 2-3 days to schedule an office appointment     for  2 weeks           REPEAT EXAM:  No           ______________________________     Barbette Hair. Arlyce Dice, MD           CC:  Excell Seltzer, MD           n.     Rosalie DoctorBarbette Hair. Kaplan at 02/14/2010 10:06 AM           Franco Nones, [phone]  Note: An exclamation mark (!) indicates a result that was not dispersed into the flowsheet. Document Creation Date: 02/14/2010 10:06 AM _______________________________________________________________________  (1) Order result status: Final Collection or observation date-time:  02/14/2010 09:53 Requested date-time:  Receipt date-time:  Reported date-time:  Referring Physician:   Ordering Physician: Melvia Heaps [id number]) Specimen Source:  Source: Launa Grill Order Number: [id number] Lab site:   Appended Document: Upper Endoscopy Patient scheduled for f/u OV with Dr. Arlyce Dice for 03/08/10 @ 2:15pm. Patient aware of appointment date and time.

## 2010-03-15 NOTE — Progress Notes (Signed)
Summary: loveza   Phone Note Call from Patient Call back at [facility] Phone [phone]   Caller: Patient Call For: Kerby Nora MD Summary of Call: Patient just had labs done for her wellness exam and her total cholesterol was 157, with her LDL of 98 and HDl of 43. She is asking if it would be okay for her to take loveza to raise her HDL. Please advise.  Initial call taken by: Melody Comas,  February 09, 2010 2:27 PM  Follow-up for Phone Call        Okay to take lovaza to increase HDL, but can also instead try OTC fish oil at higher dose 2000 mg divided daily (EPA and DHA equalling the 2000mg ) first ( insurance may not cover lovaza). If she thinks she has already tried or wants lovaza anyway... let me know.  Follow-up by: Kerby Nora MD,  February 09, 2010 2:43 PM  Additional Follow-up for Phone Call Additional follow up Details #1::        left message on machine at home for patient to return my call.  DeShannon Smith CMA Duncan Dull)  February 09, 2010 4:00 PM  left message on machine .Consuello Masse CMA  February 13, 2010 12:38 PM  Pt states she is currently taking 3000 mg's of otc fish oil now, for over a year.  She does want to try the lovaza, [facility]. Additional Follow-up by: Lowella Petties CMA, AAMA,  February 13, 2010 12:46 PM    New/Updated Medications: LOVAZA 1 GM CAPS (OMEGA-3-ACID ETHYL ESTERS) 2 cap by mouth two times a day Prescriptions: LOVAZA 1 GM CAPS (OMEGA-3-ACID ETHYL ESTERS) 2 cap by mouth two times a day  #120 x 11   Entered and Authorized by:   Kerby Nora MD   Signed by:   Kerby Nora MD on 02/13/2010   Method used:   Electronically to        [facility]* (retail)       [address]       [address] Shipping/mailing       [place], [place]  [address]       Ph: [phone]       Fax: [phone]   RxID:   [id number]

## 2010-03-15 NOTE — Letter (Signed)
Summary: Results Letter  [facility]  [address] [place], [place] [address]   Phone: [phone]  Fax: [phone]        February 19, 2010 MRN: [id number]    [facility] [address] [place], [place]  [address]    Dear Ms. Adkison,   Your biopsies demonstrated inflammatory changes only.    Please follow the recommendations previously discussed.  Should you have any immediate concerns or questions, feel free to contact me at the office.    Sincerely,  Barbette Hair. Arlyce Dice, M.D., [facility]          Sincerely,  Louis Meckel MD  This letter has been electronically signed by your physician.  Appended Document: Results Letter Letter mailed

## 2010-03-20 ENCOUNTER — Telehealth: Payer: Self-pay | Admitting: Family Medicine

## 2010-03-21 NOTE — Assessment & Plan Note (Addendum)
Summary: EGD f/u/[facility]    History of Present Illness Visit Type: Follow-up Visit Primary GI MD: Melvia Heaps MD [facility] Primary Provider: Kerby Nora, MD Requesting Provider: na Chief Complaint: F/u from EGD. Pt c/o epigastric pain  History of Present Illness:   Casey Scott has returned for followup of her abdominal pain, diarrhea and weight loss.  She underwent an extensive evaluation including CT scanning, bloodwork including inflammatory markers, stool studies and upper endoscopy.  All tests were negative except for mild inflammation at the GE junction.  Symptoms have entirely subsided except for occasional midepigastric and left periumbilical discomfort that may awaken her.  Stools are now solids and normal.  It is notable that stool lactoferrin was positive.  Antibodies for celiac disease were negative as was her small bowel biopsy.   GI Review of Systems    Reports abdominal pain.     Location of  Abdominal pain: epigastric area.    Denies acid reflux, belching, bloating, chest pain, dysphagia with liquids, dysphagia with solids, heartburn, loss of appetite, nausea, vomiting, vomiting blood, weight loss, and  weight gain.        Denies anal fissure, black tarry stools, change in bowel habit, constipation, diarrhea, diverticulosis, fecal incontinence, heme positive stool, hemorrhoids, irritable bowel syndrome, jaundice, light color stool, liver problems, rectal bleeding, and  rectal pain.    Current Medications (verified): 1)  Lovaza 1 Gm Caps (Omega-3-Acid Ethyl Esters) .... 2 Cap By Mouth Two Times A Day 2)  Alprazolam 0.25 Mg  Tabs (Alprazolam) .Marland Kitchen.. 1-2 Tab By Mouth Daily As Needed Anxiety 3)  Vitamin C 500 Mg  Tabs (Ascorbic Acid) .... Take 1 Tablet By Mouth Once A Day 4)  Fluticasone Propionate 50 Mcg/act  Susp (Fluticasone Propionate) .... 2 Sprays Per Nostril Daily 5)  Proair Hfa 108 (90 Base) Mcg/act  Aers (Albuterol Sulfate) .... 2 Pufffs Inh Q 4 Hours As Needed Wheeze 6)   Zolpidem Tartrate 10 Mg Tabs (Zolpidem Tartrate) .Marland Kitchen.. 1 Tab By Mouth At Bedtime As Needed Insomnia 7)  Glucosamine 500 Mg Caps (Glucosamine Sulfate) .... 2 Tabs Daily 8)  Red Yeast Rice 600 Mg Tabs (Red Yeast Rice Extract) .... 2 Tabs Daily 9)  Robinul-Forte 2 Mg Tabs (Glycopyrrolate) .... Take 1 Tab Every Morning and May Take 2md Dose If Needed Each Day  Allergies (verified): No Known Drug Allergies  Past History:  Past Medical History: Reviewed history from 12/19/2009 and no changes required. Hyperlipidemia anxiety disorder-chronic Chronic insomnia Allergic rhinitis POSITIVE FAMILY HX OF COLON CANCER-MOTHER/FATHER Basal Cell Carcinoma  Past Surgical History: Reviewed history from 12/19/2009 and no changes required. 1998    Ectopic pregnancy - right ovary/tube removed 2000    Nasal surgery             miscarriages x 2 3/08-COLONOSCOPY-NORMAL([facility])  Family History: Reviewed history from 12/19/2009 and no changes required. Father: Died 80 colon CA, seizures, colon polyps Mother: Died 57 colon CA Siblings: 1 brother, DM, HTN, high cholesterol, TIA No MI < 58 DM:  MGF Colon CA:  Mom - age 11                   Dad - age 56 Family History of Colitis/Crohn's: Father  Social History: Reviewed history from 12/19/2009 and no changes required. Marital Status: Single Children: 1, healthy Occupation: Charity fundraiser at [employer] Never Smoked Alcohol use-yes, socially, 2 drinks/month Drug use-no Regular exercise-yes, 5 x/ week Diet:  fruit/veggies, (+) H2O Recently moved to [place] area Daily  Caffeine Use 2 cups coffee in AM  Review of Systems  The patient denies allergy/sinus, anemia, anxiety-new, arthritis/joint pain, back pain, blood in urine, breast changes/lumps, change in vision, confusion, cough, coughing up blood, depression-new, fainting, fatigue, fever, headaches-new, hearing problems, heart murmur, heart rhythm changes, itching, menstrual pain, muscle pains/cramps, night  sweats, nosebleeds, pregnancy symptoms, shortness of breath, skin rash, sleeping problems, sore throat, swelling of feet/legs, swollen lymph glands, thirst - excessive , urination - excessive , urination changes/pain, urine leakage, vision changes, and voice change.    Vital Signs:  Patient profile:   45 year old female Height:      66 inches Weight:      150 pounds BMI:     24.30 BSA:     1.77 Pulse rate:   72 / minute Pulse rhythm:   regular BP sitting:   98 / 60  (left arm) Cuff size:   regular  Vitals Entered By: Ok Anis CMA (March 08, 2010 2:10 PM)   Impression & Recommendations:  Problem # 1:  ABDOMINAL PAIN-MULTIPLE SITES (ICD-789.09) Assessment Improved I suspect that his symptoms of pain and diarrhea were  due to an enteric infection.  Underlying inflammatory bowel disease remains a possibility although not evident by her extensive testing.  Recommendations #1 continue Robinul p.r.n. abdominal discomfort.  No further workup at this time  Problem # 2:  HX, FAMILY, MALIGNANCY, GI TRACT (ICD-V16.0) Continue colonoscopy surveillance every 5 years.  Next  exam is 2013  Patient Instructions: 1)  Copy sent to : Casey Nora, MD 2)  Call back as needed  3)  The medication list was reviewed and reconciled.  All changed / newly prescribed medications were explained.  A complete medication list was provided to the patient / caregiver.

## 2010-03-29 NOTE — Progress Notes (Signed)
Summary: Casey Scott  Phone Note Call from Patient Call back at [facility] Phone [phone]   Caller: Patient Call For: Kerby Nora MD Summary of Call: Patient transfered her ambien rx [facility] [address] Now that she is getting it through an outside pharmacy her ins will only allow a 15 day supply. She is asking if we could send in a new rx to [facility] [address]  Initial call taken by: Melody Comas,  March 20, 2010 8:59 AM  Follow-up for Phone Call        Call to cancel porevious prescription.  Follow-up by: Kerby Nora MD,  March 20, 2010 9:10 AM  Additional Follow-up for Phone Call Additional follow up Details #1::         Due to patient's insurance, she is going to continue to use [name]. She will have her rx transfered back to [facility].  I did not call another rx to pharmacy. Additional Follow-up by: Melody Comas,  March 20, 2010 3:35 PM    Prescriptions: ZOLPIDEM TARTRATE 10 MG TABS (ZOLPIDEM TARTRATE) 1 tab by mouth at bedtime as needed insomnia  #15 x 0   Entered and Authorized by:   Kerby Nora MD   Signed by:   Kerby Nora MD on 03/20/2010   Method used:   Telephoned to ...       [facility] (retail)       [address]       [place], [place]  [id number]       Ph: [phone]       Fax: [phone]   RxID:   [id number]

## 2010-04-02 ENCOUNTER — Telehealth: Payer: Self-pay | Admitting: Family Medicine

## 2010-04-10 NOTE — Progress Notes (Signed)
Summary: xanax  Phone Note Refill Request Message from:  [employer] on April 02, 2010 2:27 PM  Refills Requested: Medication #1:  ALPRAZOLAM 0.25 MG  TABS 1-2 tab by mouth daily as needed anxiety   Supply Requested: 1 month [facility]   Method Requested: Telephone to Pharmacy Initial call taken by: Benny Lennert CMA Duncan Dull),  April 02, 2010 2:27 PM  Follow-up for Phone Call        Rx called to pharmacy Follow-up by: Benny Lennert CMA Duncan Dull),  April 03, 2010 9:43 AM    Prescriptions: ALPRAZOLAM 0.25 MG  TABS (ALPRAZOLAM) 1-2 tab by mouth daily as needed anxiety  #30 x 0   Entered and Authorized by:   Kerby Nora MD   Signed by:   Kerby Nora MD on 04/03/2010   Method used:   Telephoned to ...       [facility] (retail)       [address]       [place], [place]  [id number]       Ph: [phone]       Fax: [phone]   RxID:   [id number]

## 2010-06-06 ENCOUNTER — Other Ambulatory Visit: Payer: Self-pay | Admitting: Family Medicine

## 2010-06-12 ENCOUNTER — Other Ambulatory Visit: Payer: Self-pay | Admitting: Family Medicine

## 2010-06-13 NOTE — Telephone Encounter (Signed)
Rx called to pharmacy

## 2010-06-29 NOTE — Assessment & Plan Note (Signed)
[facility]                           [place] OFFICE NOTE   KANIAH, RIZZOLO                       MRN:          [id number]  DATE:04/07/2006                            DOB:          11/17/1965    CHIEF COMPLAINT:  A 45 year old white female here to establish new  doctor.   HISTORY OF PRESENT ILLNESS:  Ms. Rockefeller states that she has been in  good health over the past few years. She has not seen a doctor since  seeing Dr. Judithann Sheen years ago. She has the following current complaints:   1. Rash, chronic:  She reports that she has noticed a small      erythematous round plaque-like rash on her right lower back. She      states that it is not getting any bigger or changing in size. She      would not have noticed it unless her boyfriend had pointed it out.      She denies any itching. She tried antifungal cream as well as an      antibiotic ointment on it without any improvement.  2. Anterior chest wall tenderness, chronic:  She states that this has      been going on for about six months. She states that sometimes it is      worse than others. She has tenderness over her central sternum      which increases when she pushes on it and leans forward and pushes      on it. She also states that the pain increases when she stretches      her shoulders backwards. It is a mild ache. She denies any redness,      mass, no breast tenderness. She denies any change with exertion,      although she does have some worsening pain with lifting weights for      her chest and arms. She denies any change in the pain with laying      flat, eating or with deep breaths.   REVIEW OF SYSTEMS:  Otherwise, negative.   PAST MEDICAL HISTORY:  1. 1990s abnormal Paps resulting in colposcopy and cryotherapy.  2. Both parents with colon cancer.   HOSPITALIZATION/SURGERIES/PROCEDURES:  1. 1998 ectopic pregnancy resulting in right ovarian tube removal.  2. 2000 nasal  surgery.  3. Miscarriages x2.  4. Mammogram 2003 negative.  5. Pap smear in 2002 negative.  6. Colonoscopy 2002 negative.   ALLERGIES:  None.   MEDICATIONS:  None.   SOCIAL HISTORY:  No tobacco use. Social alcohol use with two drinks  every 1-2 months. No history of drug use. 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 She is divorced and has one child who is healthy. She  exercises five times per week. She eats fruits, vegetables, and drinks  lots of water.   FAMILY HISTORY:  Father deceased at age 50 with colon cancer and  seizures. Mother deceased at age 32 with colon cancer. Paternal  grandmother with osteoporosis. No family history of heart attack before  age 62. She has  one brother who is obese and has diabetes, hypertension,  high cholesterol, and TIA. She has a maternal grandmother with diabetes.  There is no other type of cancer in her family.   PHYSICAL EXAMINATION:  VITAL SIGNS:  Height 65-1/2 inches, weight 157  making BMI 24, blood pressure 118/82, pulse 72, temperature 98.5.  GENERAL:  A healthy-appearing female in no apparent distress.  HEENT:  PERRLA, extraocular muscles intact. Oropharynx clear. Tympanic  membranes clear. Nares clear. No thyromegaly. No lymphadenopathy  supraclavicular or cervical.  SKIN:  3 cm well-circumscribed erythematous plaque with scaling on right  lower back. No central clearing, no satellite lesions. No increased  warmth or underlying indurations or fluctuance.  CARDIOVASCULAR:  Regular rate and rhythm. No murmurs, rubs or gallops.  Normal PMI, 2+ peripheral pulses. No peripheral edema. Tenderness to  palpation along central sternum as well as at edges of sternum  bilaterally.  PULMONARY:  Clear to auscultation bilaterally. No wheezes, rales or  rhonchi.  ABDOMEN:  Soft, nontender. No active bowel sounds, no  hepatosplenomegaly.  MUSCULOSKELETAL:  Strength 5/5 in upper and lower extremities.  NEUROLOGICAL:  Cranial nerves II-XII  grossly intact. Patellar reflexes  2+. Sensation intact in upper and lower extremities.   PROCEDURES:  KOH of skin lesion negative.   ASSESSMENT/PLAN:  1. Chest wall tenderness:  This is most likely secondary to frequent      weight lifting. This may be costochondritis versus other      musculoskeletal issue. She was instructed to limit chest wall      exercises for at least two weeks. She will apply ice to the area as      needed. She will do some gentle chest wall stretching exercises.      She will begin diclofenac 75 mg p.o. b.i.d. scheduled for 4-5 days      and then go to p.r.n. She will let me know if her pain does not      seem to improve over the next few weeks. We did discuss the      possibility of getting a chest CT to evaluate her chest wall, and      at this point in time this is most likely unnecessary. Patient was      in full agreement.  2. 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 I will      begin by treating her with triamcinolone 0.1% b.i.d. for the next      two weeks. If there is no improvement of the rash, we can consider      biopsy.  3. Prevention:  She will be scheduled for a mammogram. She will be      scheduled to return for Pap smear. Given her strong family history      of colon cancer and previous recommendations      that this be done every five years, she will be scheduled for      colonoscopy. She is up to date      with vaccines. I encouraged her to continue healthy eating habits      and regular exercise. We will obtain a cholesterol and CMET for      screening.     Kerby Nora, MD  Electronically Signed    AB/MedQ  DD: 04/07/2006  DT: 04/07/2006  Job #: [id number]

## 2010-07-02 ENCOUNTER — Other Ambulatory Visit: Payer: Self-pay | Admitting: Family Medicine

## 2010-07-02 NOTE — Telephone Encounter (Signed)
Faxed to pharmacy

## 2010-07-12 ENCOUNTER — Encounter: Payer: Self-pay | Admitting: Family Medicine

## 2010-07-13 ENCOUNTER — Encounter: Payer: Self-pay | Admitting: Family Medicine

## 2010-07-13 ENCOUNTER — Ambulatory Visit (INDEPENDENT_AMBULATORY_CARE_PROVIDER_SITE_OTHER): Payer: 59 | Admitting: Family Medicine

## 2010-07-13 VITALS — BP 110/78 | HR 74 | Temp 98.4°F | Ht 66.0 in | Wt 146.8 lb

## 2010-07-13 DIAGNOSIS — Z01419 Encounter for gynecological examination (general) (routine) without abnormal findings: Secondary | ICD-10-CM

## 2010-07-13 DIAGNOSIS — Z0000 Encounter for general adult medical examination without abnormal findings: Secondary | ICD-10-CM

## 2010-07-13 DIAGNOSIS — J019 Acute sinusitis, unspecified: Secondary | ICD-10-CM | POA: Insufficient documentation

## 2010-07-13 DIAGNOSIS — Z1231 Encounter for screening mammogram for malignant neoplasm of breast: Secondary | ICD-10-CM

## 2010-07-13 MED ORDER — CLARITHROMYCIN 500 MG PO TABS: 500.0000 mg | ORAL_TABLET | Freq: Two times a day (BID) | ORAL | Status: AC

## 2010-07-13 NOTE — Assessment & Plan Note (Signed)
With history of chronic sinus issues. Recommended mucinex, nasal saline and allergy regimen. If not improving as expected fill antibiotic.

## 2010-07-13 NOTE — Patient Instructions (Signed)
Continue Zyrtec, nasal saline spray and flonase. If symptoms are not improving, may fill prescriotion for antibiotics.

## 2010-07-13 NOTE — Progress Notes (Signed)
Subjective:    Patient ID: Casey Scott, female    DOB: 1965/07/22, 45 y.o.   MRN: [id number]  HPI  The patient is here for annual wellness exam and preventative care.    She has the following other issues, acute and chronic.  ? Sinus infection: Has burning in sinuses, facial pain, headache sneezing, sore throat. Post nasal drip. Has had for 1 day. Took advil for pain, nothing else.  Using flonase 2 sprays and zyrtec.   Anxiety:Well controlled. Using xanax every few days.  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 Will fax to [place].   Review of Systems  Constitutional: Negative for fever and fatigue.  HENT: Positive for congestion and rhinorrhea. Negative for ear pain and neck pain.   Eyes: Negative for pain.  Respiratory: Negative for cough, shortness of breath and wheezing.   Cardiovascular: Negative for chest pain, palpitations and leg swelling.  Gastrointestinal: Negative for abdominal pain, diarrhea and constipation.  Genitourinary: Negative for dysuria, hematuria and pelvic pain.  Psychiatric/Behavioral: Negative for dysphoric mood and agitation.       Objective:   Physical Exam  Constitutional: Vital signs are normal. She appears well-developed and well-nourished. She is cooperative.  Non-toxic appearance. She does not appear ill. No distress.  HENT:  Head: Normocephalic.  Right Ear: Hearing, tympanic membrane, external ear and ear canal normal.  Left Ear: Hearing, tympanic membrane, external ear and ear canal normal.  Nose: Nose normal.  Eyes: Conjunctivae, EOM and lids are normal. Pupils are equal, round, and reactive to light. No foreign bodies found.  Neck: Trachea normal and normal range of motion. Neck supple. Carotid bruit is not present. No mass and no thyromegaly present.  Cardiovascular: Normal rate, regular rhythm, S1 normal, S2 normal, normal heart sounds and intact distal pulses.  Exam reveals no gallop.    No murmur heard. Pulmonary/Chest: Effort normal and breath sounds normal. No respiratory distress. She has no wheezes. She has no rhonchi. She has no rales.  Abdominal: Soft. Normal appearance and bowel sounds are normal. She exhibits no distension, no fluid wave, no abdominal bruit and no mass. There is no hepatosplenomegaly. There is no tenderness. There is no rebound, no guarding and no CVA tenderness. No hernia.  Genitourinary: Vagina normal and uterus normal. No breast swelling, tenderness, discharge or bleeding. Pelvic exam was performed with patient prone. There is no rash, tenderness or lesion on the right labia. There is no rash, tenderness or lesion on the left labia. Uterus is not enlarged and not tender. Cervix exhibits motion tenderness. Cervix exhibits no discharge and no friability. Right adnexum displays no mass, no tenderness and no fullness. Left adnexum displays no mass, no tenderness and no fullness.       No pap performed.  Lymphadenopathy:    She has no cervical adenopathy.    She has no axillary adenopathy.  Neurological: She is alert. She has normal strength. No cranial nerve deficit or sensory deficit.  Skin: Skin is warm, dry and intact. No rash noted.  Psychiatric: Her speech is normal and behavior is normal. Judgment normal. Her mood appears not anxious. Cognition and memory are normal. She does not exhibit a depressed mood.          Assessment & Plan:  Complete Physical Exam; .The patient's preventative maintenance and recommended screening tests for an annual wellness exam were reviewed in full today. Brought up to date unless services declined.  Counselled on  the importance of diet, exercise, and its role in overall health and mortality. The patient's FH and SH was reviewed, including their home life, tobacco status, and drug and alcohol status.

## 2010-07-16 NOTE — Progress Notes (Signed)
Addended by: Kerby Nora E on: 07/16/2010 12:48 PM   Modules accepted: Orders

## 2010-10-01 ENCOUNTER — Other Ambulatory Visit: Payer: Self-pay | Admitting: Family Medicine

## 2010-10-01 NOTE — Telephone Encounter (Signed)
Pt is asking if she can have additional refills on the xanax.

## 2010-10-03 ENCOUNTER — Other Ambulatory Visit: Payer: Self-pay | Admitting: *Deleted

## 2010-10-04 NOTE — Telephone Encounter (Signed)
Opened in error

## 2010-10-04 NOTE — Telephone Encounter (Signed)
Rx called to pharmacy and advised no more then one month of  refill with controlled substance

## 2010-10-04 NOTE — Telephone Encounter (Signed)
I typically do not given refills to  controlled substances. Give 1 month supply at a time.

## 2010-12-28 ENCOUNTER — Other Ambulatory Visit: Payer: Self-pay | Admitting: *Deleted

## 2010-12-28 MED ORDER — ZOLPIDEM TARTRATE 10 MG PO TABS: 10.0000 mg | ORAL_TABLET | Freq: Every evening | ORAL | Status: DC | PRN

## 2010-12-28 NOTE — Telephone Encounter (Signed)
rx called from pharmacy

## 2011-03-15 ENCOUNTER — Encounter: Payer: Self-pay | Admitting: Gastroenterology

## 2011-03-29 ENCOUNTER — Ambulatory Visit (INDEPENDENT_AMBULATORY_CARE_PROVIDER_SITE_OTHER): Payer: 59 | Admitting: Family Medicine

## 2011-03-29 ENCOUNTER — Encounter: Payer: Self-pay | Admitting: Family Medicine

## 2011-03-29 VITALS — BP 110/66 | HR 74 | Temp 98.2°F | Wt 156.0 lb

## 2011-03-29 DIAGNOSIS — N898 Other specified noninflammatory disorders of vagina: Secondary | ICD-10-CM

## 2011-03-29 DIAGNOSIS — N939 Abnormal uterine and vaginal bleeding, unspecified: Secondary | ICD-10-CM

## 2011-03-29 DIAGNOSIS — N920 Excessive and frequent menstruation with regular cycle: Secondary | ICD-10-CM

## 2011-03-29 LAB — POCT URINE PREGNANCY: Preg Test, Ur: NEGATIVE

## 2011-03-29 LAB — POCT INR: INR: 1

## 2011-03-29 MED ORDER — ZOLPIDEM TARTRATE 10 MG PO TABS: 10.0000 mg | ORAL_TABLET | Freq: Every evening | ORAL | Status: DC | PRN

## 2011-03-29 MED ORDER — MEDROXYPROGESTERONE ACETATE 5 MG PO TABS: 5.0000 mg | ORAL_TABLET | Freq: Every day | ORAL | Status: DC

## 2011-03-29 NOTE — Progress Notes (Signed)
Period for 9 days.  Heavy, clots.  5 pads per day.  Not on OCPs.    Previously menses ~q28 days.  Last period before this was in 1/12.  No skipped periods.  This menses started a few days late, then started with normal flow.  Usually a period if ~5-6 days.  Usually she'll have breast tenderness, cramping, but not with this period.  Dizzy on standing, fatigued.  Not passing out.   No FCNAV. No diarrhea.    Mother and grandmother had TAH.    2 miscarriages and 1 ectopic, s/p R fallopian tube removal.  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  No other bleeding, bruising.    Upreg was neg before this period started.    Labs reviewed with patient.  nad ncat Mmm rrr ctab abd benign Normal introitus for age, no external lesions, no vaginal discharge but small amount of blood per os, mucosa pink and moist, no vaginal or cervical lesions, no vaginal atrophy, no friaility, normal uterus size and position, no adnexal masses or tenderness.  Chaperoned exam.

## 2011-03-29 NOTE — Patient Instructions (Signed)
Take the provera for 7 days and let us know if you continue to have trouble.  Take care.

## 2011-03-31 DIAGNOSIS — N939 Abnormal uterine and vaginal bleeding, unspecified: Secondary | ICD-10-CM | POA: Insufficient documentation

## 2011-03-31 NOTE — Assessment & Plan Note (Signed)
First episode, reassuring exam and labs.  Will treat with provera to stabilize and then allow w/d bleed.  If continued, will need eval by gyn and/or pelvic u/s.  She agrees.  F/u prn.

## 2011-04-05 ENCOUNTER — Telehealth: Payer: Self-pay | Admitting: Family Medicine

## 2011-04-05 ENCOUNTER — Other Ambulatory Visit: Payer: Self-pay

## 2011-04-05 NOTE — Telephone Encounter (Signed)
Patient was seen  On 03/29/11.  Patient has taken all of the Provera to try and stop her menses.  The medication didn't work.  Patient uses [facility].  Please advise.

## 2011-04-05 NOTE — Telephone Encounter (Signed)
Received fax refill request from patients pharmacy.  Okay to refill? 

## 2011-04-05 NOTE — Telephone Encounter (Signed)
I called pt.  No sig change.  I talked with her about options.  Offered u/s, gyn referral, or both.  She knew a doc she could call about getting the u/s set up and didn't need referral.  I'll defer to her, she'll call back if referral needed.  She agrees with plan.

## 2011-04-06 NOTE — Telephone Encounter (Signed)
Ok to refill #30, 0 refills 

## 2011-04-08 MED ORDER — ALPRAZOLAM 0.25 MG PO TABS: 0.2500 mg | ORAL_TABLET | Freq: Every day | ORAL | Status: DC | PRN

## 2011-04-08 NOTE — Telephone Encounter (Signed)
rx called to pharmacy 

## 2011-07-01 ENCOUNTER — Other Ambulatory Visit: Payer: Self-pay | Admitting: Family Medicine

## 2011-07-01 NOTE — Telephone Encounter (Signed)
Electronic refill request

## 2011-07-02 NOTE — Telephone Encounter (Signed)
rx called pharmacy  

## 2011-07-17 ENCOUNTER — Other Ambulatory Visit: Payer: Self-pay | Admitting: Family Medicine

## 2011-07-17 DIAGNOSIS — Z1231 Encounter for screening mammogram for malignant neoplasm of breast: Secondary | ICD-10-CM

## 2011-08-07 ENCOUNTER — Other Ambulatory Visit: Payer: Self-pay | Admitting: Family Medicine

## 2011-08-07 NOTE — Telephone Encounter (Signed)
Due for CPX or annual follow up if sees GYN.. Schedule then okay to refill.

## 2011-08-08 ENCOUNTER — Ambulatory Visit (HOSPITAL_COMMUNITY)
Admission: RE | Admit: 2011-08-08 | Discharge: 2011-08-08 | Disposition: A | Discharge status: Home / Self Care | Payer: 59 | Source: Ambulatory Visit | Attending: Family Medicine | Admitting: Family Medicine

## 2011-08-08 DIAGNOSIS — Z1231 Encounter for screening mammogram for malignant neoplasm of breast: Secondary | ICD-10-CM | POA: Insufficient documentation

## 2011-08-08 NOTE — Telephone Encounter (Signed)
Patient advised appt made and rx called in 30 with 0

## 2011-08-20 ENCOUNTER — Encounter: Payer: Self-pay | Admitting: *Deleted

## 2011-09-10 ENCOUNTER — Encounter: Payer: 59 | Admitting: Family Medicine

## 2011-12-23 ENCOUNTER — Encounter: Payer: Self-pay | Admitting: Gastroenterology

## 2012-02-03 IMAGING — CR DG ABDOMEN 3V
1 series · 4 of 4 positions shown · non-contrast
Comparison: none

REASON FOR EXAM: 2 months abd pain, weight loss of 25 lbs & diarrhea
COMMENTS:

PROCEDURE:     KDR - KDXR ABDOMEN COMPLETE  - December 06, 2009  [DATE]
RESULT:     Supine and upright views of the abdomen are submitted. The bowel
gas pattern is nonspecific. There is no evidence of obstruction or ileus.
The bony structures are normal in appearance. A tampon is in place.

[Series 2: view not recorded · 0.17mm/px · 4 of 4 slices shown]
[im 1/4]
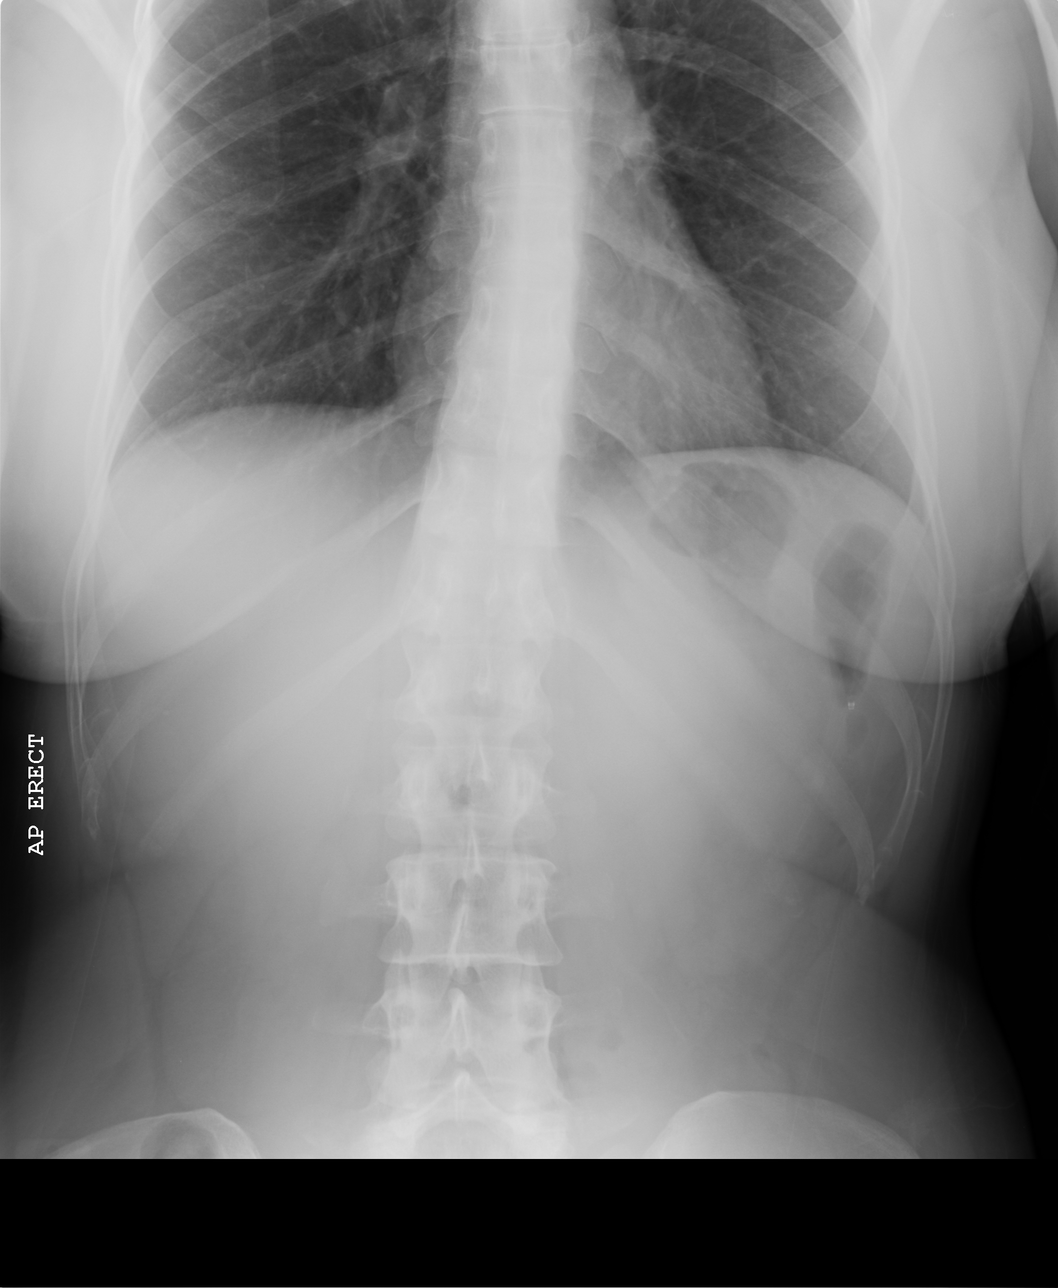
[im 2/4]
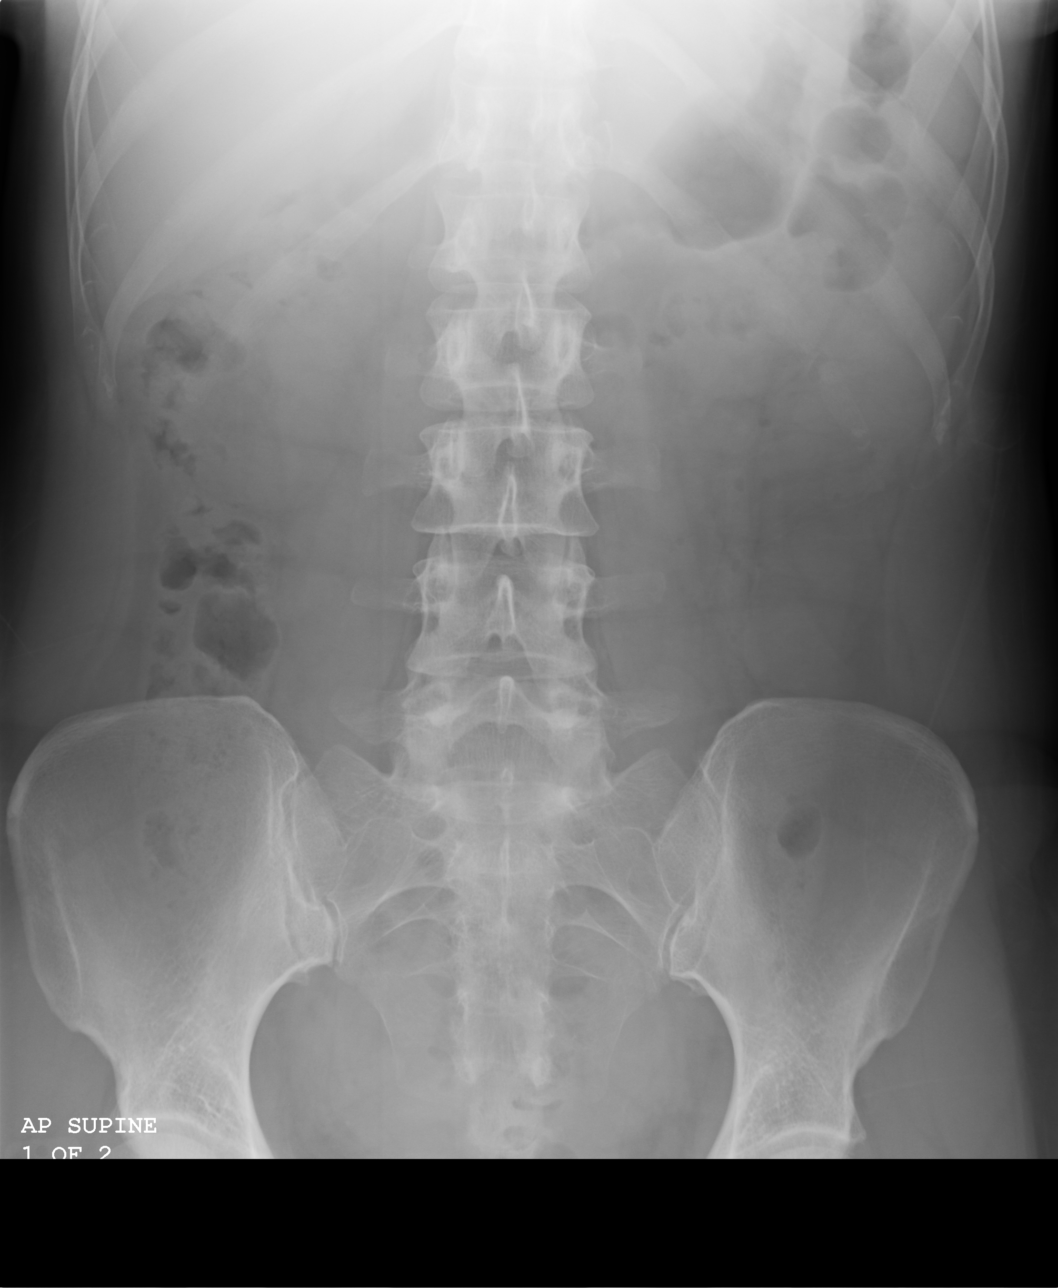
[im 3/4]
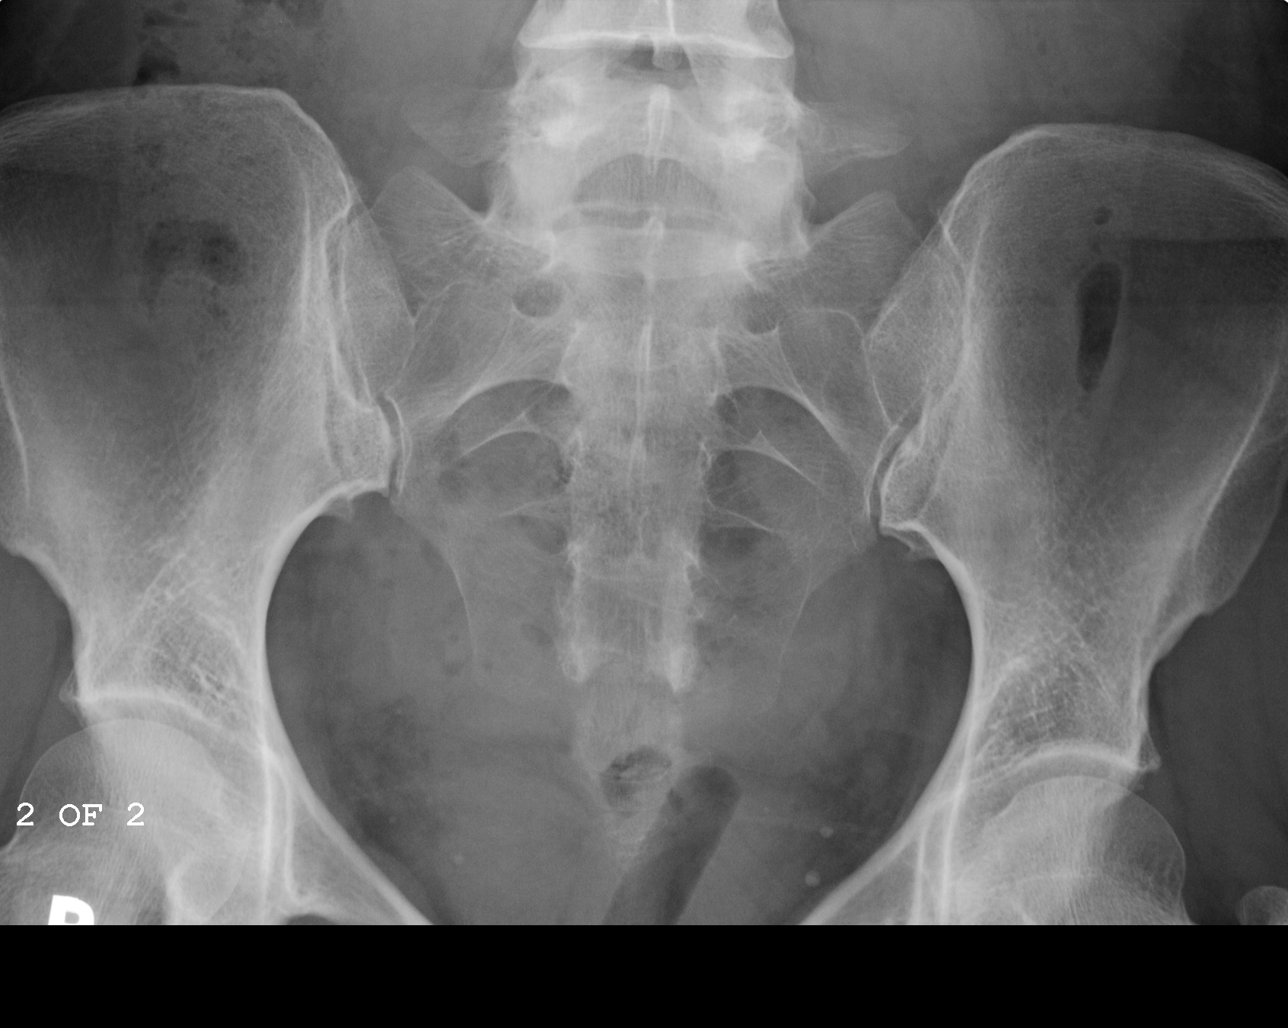
[im 4/4]
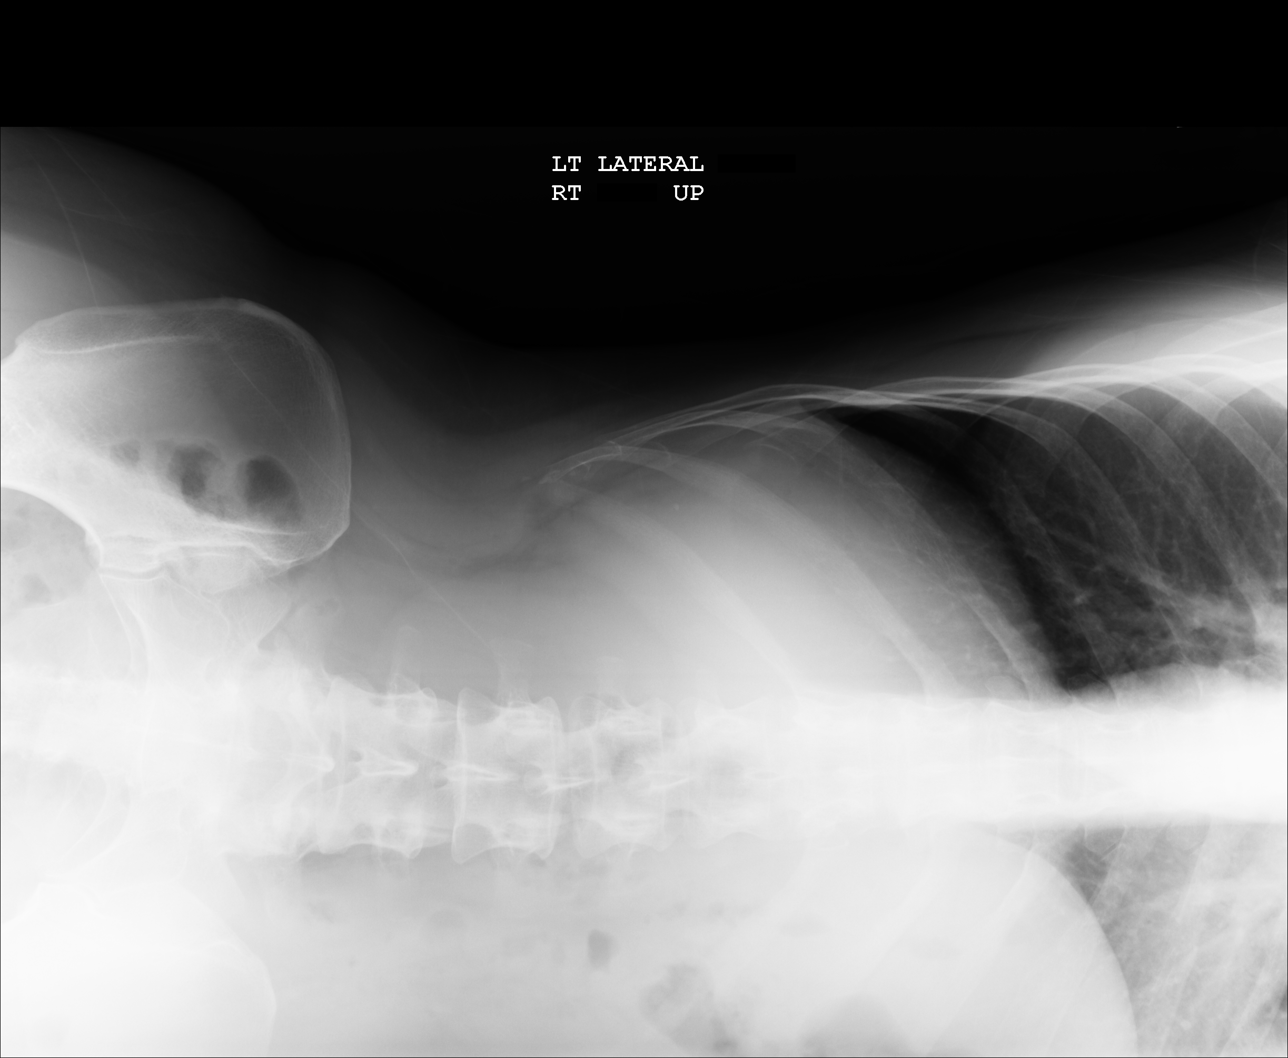

[4 of 4 positions shown; findings below may reference images not displayed]

IMPRESSION: I see no evidence of obstruction or ileus or other acute
bowel abnormality. If the patient's symptoms persist and remain unexplained,
further evaluation with CT scanning may be may be the most useful next
diagnostic step.

## 2012-02-03 IMAGING — CR DG CHEST 1V
1 series · 1 of 1 positions shown · non-contrast
Comparison: none

REASON FOR EXAM: 2 months abd pain, weight loss of 25 lbs & diarrhea
COMMENTS:

PROCEDURE:     KDR - KDXR CHEST 1 VIEW AP OR PA  - December 06, 2009  [DATE]
RESULT:     The lungs are well-expanded and clear. The cardiac silhouette is
normal in size. The pulmonary vascularity is not engorged. There is no
pleural effusion. I see no acute bony abnormality.

[view not recorded]
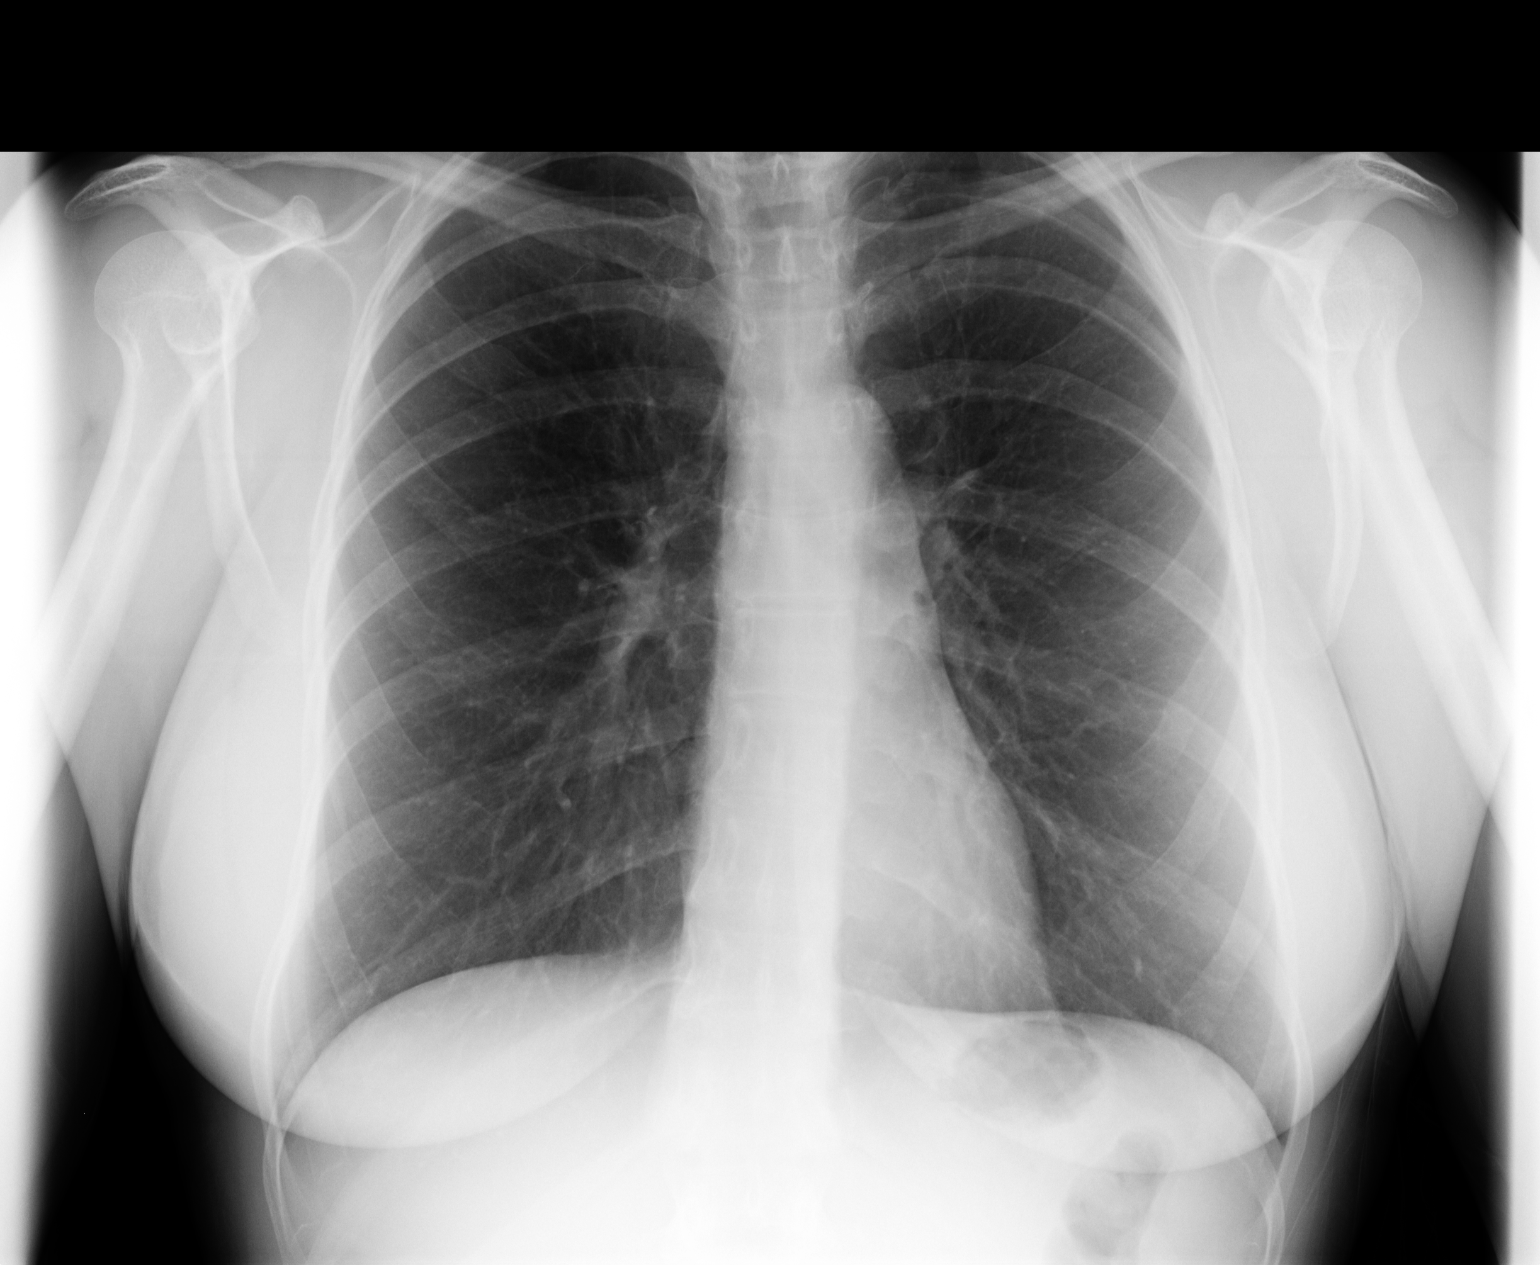

[1 of 1 positions shown; findings below may reference images not displayed]

IMPRESSION: I do not see evidence of acute cardiopulmonary abnormality.

## 2013-03-26 ENCOUNTER — Ambulatory Visit: Payer: Self-pay | Admitting: Family Medicine

## 2013-09-08 ENCOUNTER — Encounter: Payer: Self-pay | Admitting: Gastroenterology

## 2013-11-12 ENCOUNTER — Ambulatory Visit: Payer: 59 | Admitting: Family Medicine

## 2014-02-24 ENCOUNTER — Ambulatory Visit (INDEPENDENT_AMBULATORY_CARE_PROVIDER_SITE_OTHER): Payer: 59 | Admitting: Family Medicine

## 2014-02-24 ENCOUNTER — Other Ambulatory Visit (HOSPITAL_COMMUNITY)
Admission: RE | Admit: 2014-02-24 | Discharge: 2014-02-24 | Disposition: A | Discharge status: Home / Self Care | Payer: 59 | Source: Ambulatory Visit | Attending: Family Medicine | Admitting: Family Medicine

## 2014-02-24 ENCOUNTER — Encounter: Payer: Self-pay | Admitting: Family Medicine

## 2014-02-24 VITALS — BP 104/70 | HR 63 | Temp 98.2°F | Ht 65.0 in | Wt 164.2 lb

## 2014-02-24 DIAGNOSIS — E785 Hyperlipidemia, unspecified: Secondary | ICD-10-CM

## 2014-02-24 DIAGNOSIS — Z1151 Encounter for screening for human papillomavirus (HPV): Secondary | ICD-10-CM | POA: Diagnosis present

## 2014-02-24 DIAGNOSIS — Z01419 Encounter for gynecological examination (general) (routine) without abnormal findings: Secondary | ICD-10-CM | POA: Diagnosis not present

## 2014-02-24 DIAGNOSIS — Z124 Encounter for screening for malignant neoplasm of cervix: Secondary | ICD-10-CM

## 2014-02-24 DIAGNOSIS — Z0000 Encounter for general adult medical examination without abnormal findings: Secondary | ICD-10-CM

## 2014-02-24 MED ORDER — ICOSAPENT ETHYL 1 G PO CAPS: 2.0000 | ORAL_CAPSULE | Freq: Two times a day (BID) | ORAL | Status: AC

## 2014-02-24 MED ORDER — ZOLPIDEM TARTRATE 10 MG PO TABS: 10.0000 mg | ORAL_TABLET | Freq: Every evening | ORAL | Status: DC | PRN

## 2014-02-24 NOTE — Assessment & Plan Note (Signed)
Not at goal.  She is getting back to healthy eating.  Consider welchol or re-trail of red yeast rice ( 2400 mg divided daily.

## 2014-02-24 NOTE — Addendum Note (Signed)
Addended by: Carter Kitten on: 02/24/2014 12:07 PM   Modules accepted: Orders

## 2014-02-24 NOTE — Progress Notes (Signed)
The patient is here for annual wellness exam and preventative care.    Feeling  Well overall.  Exercise: Training four days a week. Diet: Fruits and veggies.  She had labs done in Dec  2015 at work..   Hyperlipidemia: Chol: total 249, HDL 68, LDL 164 tri 84 DM screen: nml. Stop red yeast rice and lovaza.  Had SE to statin low dose.Marland Kitchen   Anxiety:Well controlled. Using xanax every few days.   Review of Systems  Constitutional: Negative for fever and fatigue.  HENT: Negative for ear pain and neck pain.  Eyes: Negative for pain.  Respiratory: Negative for cough, shortness of breath and wheezing.  Cardiovascular: Negative for chest pain, palpitations and leg swelling.  Gastrointestinal: Negative for abdominal pain, diarrhea and constipation.  Genitourinary: Negative for dysuria, hematuria and pelvic pain.  Psychiatric/Behavioral: Negative for dysphoric mood and agitation.       Objective:   Physical Exam  Constitutional: Vital signs are normal. She appears well-developed and well-nourished. She is cooperative. Non-toxic appearance. She does not appear ill. No distress.  HENT:  Head: Normocephalic.  Right Ear: Hearing, tympanic membrane, external ear and ear canal normal.  Left Ear: Hearing, tympanic membrane, external ear and ear canal normal.  Nose: Nose normal.  Eyes: Conjunctivae, EOM and lids are normal. Pupils are equal, round, and reactive to light. No foreign bodies found.  Neck: Trachea normal and normal range of motion. Neck supple. Carotid bruit is not present. No mass and no thyromegaly present.  Cardiovascular: Normal rate, regular rhythm, S1 normal, S2 normal, normal heart sounds and intact distal pulses. Exam reveals no gallop.  No murmur heard. Pulmonary/Chest: Effort normal and breath sounds normal. No respiratory distress. She has no wheezes. She has no rhonchi. She has no rales.  Abdominal: Soft. Normal appearance and bowel sounds are normal. She exhibits  no distension, no fluid wave, no abdominal bruit and no mass. There is no hepatosplenomegaly. There is no tenderness. There is no rebound, no guarding and no CVA tenderness. No hernia.  Genitourinary: Breast exam, no masses, no nipples.  Vagina normal and uterus normal. No breast swelling, tenderness, discharge or bleeding. Pelvic exam was performed with patient prone. There is no rash, tenderness or lesion on the right labia. There is no rash, tenderness or lesion on the left labia. Uterus is not enlarged and not tender. Cervix exhibits no tenderness. Cervix exhibits no discharge and no friability. Right adnexum displays no mass, no tenderness and no fullness. Left adnexum displays no mass, no tenderness and no fullness.  Pap performed.  Lymphadenopathy:   She has no cervical adenopathy.   She has no axillary adenopathy.  Neurological: She is alert. She has normal strength. No cranial nerve deficit or sensory deficit.  Skin: Skin is warm, dry and intact. No rash noted.  Psychiatric: Her speech is normal and behavior is normal. Judgment normal. Her mood appears not anxious. Cognition and memory are normal. She does not exhibit a depressed mood.          Assessment & Plan:  Complete Physical Exam; .The patient's preventative maintenance and recommended screening tests for an annual wellness exam were reviewed in full today. Brought up to date unless services declined.  Counselled on the importance of diet, exercise, and its role in overall health and mortality. The patient's FH and SH was reviewed, including their home life, tobacco status, and drug and alcohol status.   Vaccines: Uptodate, flu at work Parents with  colon cancer age  47/55, coloncosopy 2008, due every 5 years.  Mammo: 03/2013 nml PAP/DVE: nml pap 2011, on q 3 years.. Due this year.

## 2014-02-24 NOTE — Addendum Note (Signed)
Addended by: Eliezer Lofts E on: 02/24/2014 12:11 PM   Modules accepted: Orders, SmartSet

## 2014-02-24 NOTE — Progress Notes (Signed)
Pre visit review using our clinic review tool, if applicable. No additional management support is needed unless otherwise documented below in the visit note. 

## 2014-02-24 NOTE — Patient Instructions (Addendum)
Can try 2 tab 600 mg twice daily (2400 mg) of red yeast rice.  Work on [employer].  Make sure to set up colonoscopy this year.  Schedule mammo on own.

## 2014-02-25 LAB — CYTOLOGY - PAP

## 2014-03-01 ENCOUNTER — Encounter: Payer: Self-pay | Admitting: *Deleted

## 2014-06-02 ENCOUNTER — Other Ambulatory Visit: Payer: Self-pay | Admitting: Family Medicine

## 2014-06-02 NOTE — Telephone Encounter (Signed)
Last office visit 02/24/2014.  Last refilled 02/24/2014 for #90 with no refills.  Ok to refill?

## 2014-06-02 NOTE — Telephone Encounter (Signed)
Called to [facility]. 

## 2014-09-09 ENCOUNTER — Other Ambulatory Visit: Payer: Self-pay | Admitting: Family Medicine

## 2014-09-09 NOTE — Telephone Encounter (Signed)
Last office visit 02/24/2014.  Last refilled 06/02/2014 for #90 with no refills.  Ok to refill?

## 2014-09-12 NOTE — Telephone Encounter (Signed)
Called into [facility]. 

## 2014-12-08 ENCOUNTER — Other Ambulatory Visit: Payer: Self-pay | Admitting: Family Medicine

## 2014-12-08 NOTE — Telephone Encounter (Signed)
Last office visit 02/24/2014.  Last refilled 09/12/2014 for #90 with no refills.  Ok to refill?

## 2014-12-08 NOTE — Telephone Encounter (Addendum)
Ambien called into [facility].

## 2015-03-03 ENCOUNTER — Other Ambulatory Visit: Payer: Self-pay | Admitting: Family Medicine

## 2015-03-03 NOTE — Telephone Encounter (Signed)
Last office visit 02/24/2014.  Last refilled 12/08/2014 for #90 with no refills.  No future appointments scheduled.  Ok to refill?

## 2015-03-03 NOTE — Telephone Encounter (Signed)
CPE scheduled for 03/27/2014 at 7:15 am with Dr. Randa Spike.  Cynthia will have her labs drawn at work.  Ambien called into [facility].

## 2015-03-03 NOTE — Telephone Encounter (Signed)
Needs appt for CPX or med follow up. Refill until then.

## 2015-03-16 ENCOUNTER — Telehealth: Payer: Self-pay | Admitting: Family Medicine

## 2015-03-16 NOTE — Telephone Encounter (Signed)
Lucee notified that we have not received labs.  She will request [facility] fax them to my direct fax at [phone].

## 2015-03-16 NOTE — Telephone Encounter (Signed)
Pt called and wanted to make sure that her labs that were completed at [facility] the week of 03/06/15 were sent to [facility].  She wants to make sure that PCP received results prior to appointment on 03/28/15.  Best number to call is [phone] if labs need to be re-sent

## 2015-03-28 ENCOUNTER — Encounter: Payer: Self-pay | Admitting: Family Medicine

## 2015-03-28 ENCOUNTER — Ambulatory Visit (INDEPENDENT_AMBULATORY_CARE_PROVIDER_SITE_OTHER): Payer: 59 | Admitting: Family Medicine

## 2015-03-28 VITALS — BP 104/66 | HR 64 | Temp 98.1°F | Ht 65.25 in | Wt 163.5 lb

## 2015-03-28 DIAGNOSIS — G4700 Insomnia, unspecified: Secondary | ICD-10-CM

## 2015-03-28 DIAGNOSIS — Z0000 Encounter for general adult medical examination without abnormal findings: Secondary | ICD-10-CM

## 2015-03-28 DIAGNOSIS — E785 Hyperlipidemia, unspecified: Secondary | ICD-10-CM

## 2015-03-28 NOTE — Progress Notes (Signed)
Pre visit review using our clinic review tool, if applicable. No additional management support is needed unless otherwise documented below in the visit note. 

## 2015-03-28 NOTE — Assessment & Plan Note (Signed)
Stable on ambien nightly.

## 2015-03-28 NOTE — Assessment & Plan Note (Signed)
Improved by 20 points from last year. SE to statins in past. Offered welchol, she will consider. Encouraged exercise, weight loss, healthy eating habits.

## 2015-03-28 NOTE — Patient Instructions (Addendum)
Restart vit D supplement 400 IU twice a day.  Work on [employer] and exercise. If interested in [facility] for decreasing cholesterol, let me know. Call to schedule colonoscopy with GI. Schedule mammogram on your own.

## 2015-03-28 NOTE — Progress Notes (Signed)
The patient is here for annual wellness exam and preventative care.   Feeling well overall. No concerns. Exercise: Training three days a week. Diet: Fruits and veggies.  She had labs done in Dec 2015 at work..   Hyperlipidemia: Chol: total 223, HDL 56, LDL 147 tri 100  Almost LDL at goal < 130. She is working on weight loss and lifestyle changes. DM screen: nml. glucose Stopped red yeast rice and lovaza. Had SE to statin low dose.  Vit D low: She has stopped supplement, plan to restart.  Anxiety:Well controlled. No longer requiring xanax for anxiety. Chronic insomnia: On ambien nightly. She has trouble getting her brain to " shutoff".  Review of Systems  Constitutional: Negative for fever and fatigue.  HENT: Negative for ear pain and neck pain.  Eyes: Negative for pain.  Respiratory: Negative for cough, shortness of breath and wheezing.  Cardiovascular: Negative for chest pain, palpitations and leg swelling.  Gastrointestinal: Negative for abdominal pain, diarrhea and constipation.  Genitourinary: Negative for dysuria, hematuria and pelvic pain.  Psychiatric/Behavioral: Negative for dysphoric mood and agitation.      Objective:  Physical Exam  Constitutional: Vital signs are normal. She appears well-developed and well-nourished. She is cooperative. Non-toxic appearance. She does not appear ill. No distress.  HENT:  Head: Normocephalic.  Right Ear: Hearing, tympanic membrane, external ear and ear canal normal.  Left Ear: Hearing, tympanic membrane, external ear and ear canal normal.  Nose: Nose normal.  Eyes: Conjunctivae, EOM and lids are normal. Pupils are equal, round, and reactive to light. No foreign bodies found.  Neck: Trachea normal and normal range of motion. Neck supple. Carotid bruit is not present. No mass and no thyromegaly present.  Cardiovascular: Normal rate, regular rhythm, S1 normal, S2 normal, normal heart sounds and intact distal  pulses. Exam reveals no gallop.  No murmur heard. Pulmonary/Chest: Effort normal and breath sounds normal. No respiratory distress. She has no wheezes. She has no rhonchi. She has no rales.  Abdominal: Soft. Normal appearance and bowel sounds are normal. She exhibits no distension, no fluid wave, no abdominal bruit and no mass. There is no hepatosplenomegaly. There is no tenderness. There is no rebound, no guarding and no CVA tenderness. No hernia.  Genitourinary: Breast exam, no masses, no nipples. Vagina normal and uterus normal. No breast swelling, tenderness, discharge or bleeding. Pelvic exam was performed with patient prone. There is no rash, tenderness or lesion on the right labia. There is no rash, tenderness or lesion on the left labia. Uterus is not enlarged and not tender. Right adnexum displays no mass, no tenderness and no fullness. Left adnexum displays no mass, no tenderness and no fullness.  Pap  NOT performed.  Lymphadenopathy:   She has no cervical adenopathy.   She has no axillary adenopathy.  Neurological: She is alert. She has normal strength. No cranial nerve deficit or sensory deficit.  Skin: Skin is warm, dry and intact. No rash noted.  Psychiatric: Her speech is normal and behavior is normal. Judgment normal. Her mood appears not anxious. Cognition and memory are normal. She does not exhibit a depressed mood.         Assessment & Plan:  Complete Physical Exam; .The patient's preventative maintenance and recommended screening tests for an annual wellness exam were reviewed in full today. Brought up to date unless services declined.  Counselled on the importance of diet, exercise, and its role in overall health and mortality. The patient's FH and SH was  reviewed, including their home life, tobacco status, and drug and alcohol status.   Vaccines: Uptodate, flu at work Parents with colon cancer age 52/55, coloncosopy 2012, due every 5 years. Mammo:   Due PAP/DVE: nml pap 2016, neg HPV, on q5 years. HIV: refused  Nonsmoker

## 2015-04-05 ENCOUNTER — Other Ambulatory Visit: Payer: Self-pay | Admitting: Family Medicine

## 2015-04-05 NOTE — Telephone Encounter (Signed)
Last office visit 03/28/2015.  Last refilled 03/03/2015 for #30 with no refills.  Ok to refill?

## 2015-04-06 NOTE — Telephone Encounter (Signed)
Zolpidem called into [facility].

## 2015-06-16 ENCOUNTER — Other Ambulatory Visit: Payer: Self-pay | Admitting: Family Medicine

## 2015-06-16 NOTE — Telephone Encounter (Signed)
Last office visit 03/28/2015.  Last refilled 04/06/2015 for #90 with no refills.  Ok to refill?

## 2015-06-16 NOTE — Telephone Encounter (Signed)
Zolpidem called into [facility].

## 2015-09-18 ENCOUNTER — Other Ambulatory Visit: Payer: Self-pay | Admitting: *Deleted

## 2015-09-18 NOTE — Telephone Encounter (Signed)
Received faxed refill request from pharmacy Last refill 06/16/15 #90 Last office visit 03/28/15

## 2015-09-19 MED ORDER — ZOLPIDEM TARTRATE 10 MG PO TABS: 10.0000 mg | ORAL_TABLET | Freq: Every day | ORAL | 0 refills | Status: DC

## 2015-09-19 NOTE — Telephone Encounter (Signed)
Called in to [facility].

## 2015-12-21 ENCOUNTER — Other Ambulatory Visit: Payer: Self-pay | Admitting: *Deleted

## 2015-12-21 NOTE — Telephone Encounter (Signed)
Last office visit 04/07/2015.  Last refilled 09/19/2015 for #90 with no refills.  Ok to refill?

## 2015-12-22 MED ORDER — ZOLPIDEM TARTRATE 10 MG PO TABS: 10.0000 mg | ORAL_TABLET | Freq: Every day | ORAL | 0 refills | Status: DC

## 2015-12-22 NOTE — Telephone Encounter (Signed)
Zolpidem called into [facility].

## 2016-03-19 ENCOUNTER — Other Ambulatory Visit: Payer: Self-pay | Admitting: *Deleted

## 2016-03-19 MED ORDER — ZOLPIDEM TARTRATE 10 MG PO TABS: 10.0000 mg | ORAL_TABLET | Freq: Every day | ORAL | 0 refills | Status: DC

## 2016-03-19 NOTE — Telephone Encounter (Signed)
Last office visit 03/28/2015.  CPE scheduled for 03/28/2016.  Last refilled 12/22/2015 for #90 with no refills.  Ok to refill?

## 2016-03-19 NOTE — Telephone Encounter (Signed)
Zolpidem called into [facility] - [place], [place] - [address] Phone: [phone] 

## 2016-03-21 LAB — CBC AND DIFFERENTIAL
HCT: 39 % (ref 36–46)
HEMOGLOBIN: 13.2 g/dL (ref 12.0–16.0)
Platelets: 274 10*3/uL (ref 150–399)
WBC: 6.2 10^3/mL

## 2016-03-21 LAB — LIPID PANEL
Cholesterol: 258 mg/dL — AB (ref 0–200)
HDL: 61 mg/dL (ref 35–70)
LDL Cholesterol: 170 mg/dL
Triglycerides: 133 mg/dL (ref 40–160)

## 2016-03-21 LAB — HEPATIC FUNCTION PANEL
ALK PHOS: 44 U/L (ref 25–125)
ALT: 25 U/L (ref 7–35)
AST: 25 U/L (ref 13–35)
BILIRUBIN, TOTAL: 0.2 mg/dL

## 2016-03-21 LAB — BASIC METABOLIC PANEL
BUN: 7 mg/dL (ref 4–21)
Creatinine: 0.7 mg/dL (ref <=1.1)
GLUCOSE: 85 mg/dL
POTASSIUM: 4.1 mmol/L (ref 3.4–5.3)
SODIUM: 142 mmol/L (ref 137–147)

## 2016-03-21 LAB — TSH: TSH: 2.22 u[IU]/mL (ref <=5.90)

## 2016-03-28 ENCOUNTER — Encounter: Payer: Self-pay | Admitting: Family Medicine

## 2016-03-28 ENCOUNTER — Ambulatory Visit (INDEPENDENT_AMBULATORY_CARE_PROVIDER_SITE_OTHER): Payer: 59 | Admitting: Family Medicine

## 2016-03-28 VITALS — BP 128/78 | HR 66 | Temp 98.3°F | Ht 65.0 in | Wt 168.5 lb

## 2016-03-28 DIAGNOSIS — Z0000 Encounter for general adult medical examination without abnormal findings: Secondary | ICD-10-CM | POA: Diagnosis not present

## 2016-03-28 DIAGNOSIS — G4700 Insomnia, unspecified: Secondary | ICD-10-CM | POA: Diagnosis not present

## 2016-03-28 DIAGNOSIS — E785 Hyperlipidemia, unspecified: Secondary | ICD-10-CM | POA: Diagnosis not present

## 2016-03-28 DIAGNOSIS — Z23 Encounter for immunization: Secondary | ICD-10-CM | POA: Diagnosis not present

## 2016-03-28 MED ORDER — PRAVASTATIN SODIUM 20 MG PO TABS: 20.0000 mg | ORAL_TABLET | Freq: Every day | ORAL | 3 refills | Status: DC

## 2016-03-28 NOTE — Patient Instructions (Addendum)
Start low dose pravastatin .Marland Kitchen Try daily but if causing  Myalgia.. Decrease to 3 times a week.  Can use co Q 10 in addition to help with myalgias.  Have cholesterol rechecked in 2-3 months.  Call Gi to set up colonoscopy.

## 2016-03-28 NOTE — Progress Notes (Signed)
Pre visit review using our clinic review tool, if applicable. No additional management support is needed unless otherwise documented below in the visit note. 

## 2016-03-28 NOTE — Assessment & Plan Note (Signed)
Stable control on Ambien. Unable to sleep without it. Sleep hygeine discussed.

## 2016-03-28 NOTE — Progress Notes (Signed)
Subjective:    Patient ID: Casey Scott, female    DOB: 1965-06-07, 51 y.o.   MRN: [id number]  HPI The patient is here for annual wellness exam and preventative care.    Exercise: Diet:  Elevated Cholesterol: LDL 170, total 258, HDL 61  SE to statins, red yeast rice did not help, livalo caused SE in past, l Using medications without problems: Muscle aches:   Diet compliance:  Healthy eating, water Exercise:  3 times a week, bike at home Other complaints:  Vit D def:  glucose 85  Allergic rhinitis.. Seasonal off and on.  No longer with anxiety since done with school.  Chronic insomnia: using Ambien at night. Gets 4 hours at night  BP Readings from Last 3 Encounters:  03/28/16 128/78  03/28/15 104/66  02/24/14 104/70   Wt Readings from Last 3 Encounters:  03/28/16 168 lb 8 oz (76.4 kg)  03/28/15 163 lb 8 oz (74.2 kg)  02/24/14 164 lb 4 oz (74.5 kg)     Social History /Family History/Past Medical History reviewed and updated if needed.  Review of Systems  Constitutional: Negative for fatigue and fever.  HENT: Negative for congestion.   Eyes: Negative for pain.  Respiratory: Negative for cough and shortness of breath.   Cardiovascular: Negative for chest pain, palpitations and leg swelling.  Gastrointestinal: Negative for abdominal pain.  Genitourinary: Negative for dysuria and vaginal bleeding.  Musculoskeletal: Positive for back pain.  Neurological: Negative for syncope, light-headedness and headaches.  Psychiatric/Behavioral: Negative for dysphoric mood.       Objective:   Physical Exam  Constitutional: Vital signs are normal. She appears well-developed and well-nourished. She is cooperative.  Non-toxic appearance. She does not appear ill. No distress.  HENT:  Head: Normocephalic.  Right Ear: Hearing, tympanic membrane, external ear and ear canal normal.  Left Ear: Hearing, tympanic membrane, external ear and ear canal normal.  Nose: Nose normal.    Eyes: Conjunctivae, EOM and lids are normal. Pupils are equal, round, and reactive to light. Lids are everted and swept, no foreign bodies found.  Neck: Trachea normal and normal range of motion. Neck supple. Carotid bruit is not present. No thyroid mass and no thyromegaly present.  Cardiovascular: Normal rate, regular rhythm, S1 normal, S2 normal, normal heart sounds and intact distal pulses.  Exam reveals no gallop.   No murmur heard. Pulmonary/Chest: Effort normal and breath sounds normal. No respiratory distress. She has no wheezes. She has no rhonchi. She has no rales.  Abdominal: Soft. Normal appearance and bowel sounds are normal. She exhibits no distension, no fluid wave, no abdominal bruit and no mass. There is no hepatosplenomegaly. There is no tenderness. There is no rebound, no guarding and no CVA tenderness. No hernia.  Lymphadenopathy:    She has no cervical adenopathy.    She has no axillary adenopathy.  Neurological: She is alert. She has normal strength. No cranial nerve deficit or sensory deficit.  Skin: Skin is warm, dry and intact. No rash noted.  Psychiatric: Her speech is normal and behavior is normal. Judgment normal. Her mood appears not anxious. Cognition and memory are normal. She does not exhibit a depressed mood.    Body mass index is 28.04 kg/m.       Assessment & Plan:  The patient's preventative maintenance and recommended screening tests for an annual wellness exam were reviewed in full today. Brought up to date unless services declined.  Counselled on the importance of diet, exercise,  and its role in overall health and mortality. The patient's FH and SH was reviewed, including their home life, tobacco status, and drug and alcohol status.   Vaccines: Uptodate, flu at work except due for tdap Parents with colon cancer age 43/55, coloncosopy 2012, due every 5 years. DUE NOW Mammo:  Due PAP/DVE: nml pap 2016, neg HPV, on q5 years. HIV: refused   Nonsmoker

## 2016-03-28 NOTE — Assessment & Plan Note (Signed)
Start pravastatin low dose. May need to add zetia if LDL still not at goal.  Encouraged exercise, weight loss, healthy eating habits.

## 2016-03-28 NOTE — Addendum Note (Signed)
Addended by: Carter Kitten on: 03/28/2016 12:18 PM   Modules accepted: Orders

## 2016-06-14 ENCOUNTER — Other Ambulatory Visit: Payer: Self-pay | Admitting: Family Medicine

## 2016-06-14 NOTE — Telephone Encounter (Signed)
Zolpidem called into [place], [place] Phone: [phone]

## 2016-06-14 NOTE — Telephone Encounter (Signed)
Last office visit 03/28/2016.  Last refilled 03/19/2016 for #90 with no refills.  Ok to refill?

## 2016-07-10 LAB — LIPID PANEL
CHOLESTEROL: 266 mg/dL — AB (ref 0–200)
HDL: 63 mg/dL (ref 35–70)
LDL Cholesterol: 172 mg/dL
LDL/HDL RATIO: 2.7
Triglycerides: 157 mg/dL (ref 40–160)

## 2016-07-15 ENCOUNTER — Telehealth: Payer: Self-pay | Admitting: *Deleted

## 2016-07-15 ENCOUNTER — Encounter: Payer: Self-pay | Admitting: Family Medicine

## 2016-07-15 NOTE — Telephone Encounter (Addendum)
Casey Scott notified by telephone that her cholesterol results are not better than when checked in 03/2016.  She states that she is currently only taking her pravastatin 2 times a week.  She tried taking it daily but the muscle cramps got so bad that she had to back down.  Results abstracted into [facility].

## 2016-07-16 NOTE — Telephone Encounter (Signed)
Noted. If she aggreable to adding a nonstatin med ( zetia) to the pravastatin 2 times a week to try to lower it further? If so make appt fro lab re-eval in 3 months and let me know.

## 2016-07-16 NOTE — Telephone Encounter (Signed)
Left message for [place] to return my call

## 2016-07-17 NOTE — Telephone Encounter (Signed)
Left message for Shaniqwa to return my call

## 2016-07-18 NOTE — Telephone Encounter (Signed)
Casey Scott is agreeable to starting Zetia.  She will have her cholesterol drawn in 3 months at work and fax [place] the results.  Please sent Rx to [facility].

## 2016-07-19 MED ORDER — EZETIMIBE 10 MG PO TABS: 10.0000 mg | ORAL_TABLET | Freq: Every day | ORAL | 11 refills | Status: DC

## 2016-09-12 ENCOUNTER — Other Ambulatory Visit: Payer: Self-pay | Admitting: Family Medicine

## 2016-09-12 NOTE — Telephone Encounter (Signed)
Zolpidem called into [place], [place] Phone: [phone]

## 2016-09-12 NOTE — Telephone Encounter (Signed)
Last office visit 03/28/16.  Last refilled 06/14/2016 for #90 with no refills.  Ok to refill?

## 2017-01-24 LAB — CBC AND DIFFERENTIAL
HEMATOCRIT: 40 (ref 36–46)
HEMOGLOBIN: 13.5 (ref 12.0–16.0)
Neutrophils Absolute: 3
PLATELETS: 286 (ref 150–399)
WBC: 5.7

## 2017-01-24 LAB — LIPID PANEL
CHOLESTEROL: 199 (ref 0–200)
HDL: 65 (ref 35–70)
LDL Cholesterol: 109
LDl/HDL Ratio: 1.7
TRIGLYCERIDES: 126 (ref 40–160)

## 2017-01-24 LAB — TSH: TSH: 1.82 (ref 0.41–5.90)

## 2017-01-24 LAB — HEPATIC FUNCTION PANEL
ALK PHOS: 42 (ref 25–125)
ALT: 15 (ref 7–35)
AST: 17 (ref 13–35)
Bilirubin, Total: 0.4

## 2017-01-24 LAB — BASIC METABOLIC PANEL
BUN: 9 (ref 4–21)
Creatinine: 0.8 (ref 0.5–1.1)
GLUCOSE: 94
POTASSIUM: 3.7 (ref 3.4–5.3)
Sodium: 141 (ref 137–147)

## 2017-03-08 ENCOUNTER — Other Ambulatory Visit: Payer: Self-pay | Admitting: Family Medicine

## 2017-03-09 NOTE — Telephone Encounter (Signed)
Last office visit 03/28/16.  Last refilled 09/12/16 for #90 with 1 refill.  Ok to refill?

## 2017-03-11 ENCOUNTER — Telehealth: Payer: Self-pay | Admitting: Family Medicine

## 2017-03-11 NOTE — Telephone Encounter (Signed)
Please see 03/08/2017 refill note.

## 2017-03-11 NOTE — Telephone Encounter (Signed)
Ambien refill. Pharmacy has faxed rx request to office as well Last OV: 03/28/16 with Dr. Diona Browner Last Refill:09/12/17 with 90 tabs Pharmacy:[facility]

## 2017-03-11 NOTE — Telephone Encounter (Signed)
Copied from [place] [id number]. Topic: Quick Communication - Rx Refill/Question >> Mar 11, 2017 12:26 PM Patrice Paradise wrote: Medication: zolpidem (AMBIEN) 10 MG tablet    Has the patient contacted their pharmacy? Yes.  (PHARMACY HAS FAXED OVER THE REQUESTED AS WELL)   (Agent: If no, request that the patient contact the pharmacy for the refill.)   Preferred Pharmacy (with phone number or street name):  [place], [place] - [address] [place] [address] Phone: [phone] Fax: [phone]     Agent: Please be advised that RX refills may take up to 3 business days. We ask that you follow-up with your pharmacy.

## 2017-04-18 ENCOUNTER — Encounter: Payer: Self-pay | Admitting: Family Medicine

## 2017-04-18 ENCOUNTER — Ambulatory Visit (INDEPENDENT_AMBULATORY_CARE_PROVIDER_SITE_OTHER): Payer: BLUE CROSS/BLUE SHIELD | Admitting: Family Medicine

## 2017-04-18 ENCOUNTER — Other Ambulatory Visit: Payer: Self-pay

## 2017-04-18 VITALS — BP 114/80 | HR 65 | Temp 98.4°F | Ht 65.0 in | Wt 167.2 lb

## 2017-04-18 DIAGNOSIS — G4700 Insomnia, unspecified: Secondary | ICD-10-CM | POA: Diagnosis not present

## 2017-04-18 DIAGNOSIS — E785 Hyperlipidemia, unspecified: Secondary | ICD-10-CM

## 2017-04-18 DIAGNOSIS — Z0000 Encounter for general adult medical examination without abnormal findings: Secondary | ICD-10-CM | POA: Diagnosis not present

## 2017-04-18 NOTE — Progress Notes (Signed)
Subjective:    Patient ID: Casey Scott, female    DOB: 1965-04-06, 52 y.o.   MRN: [id number]  HPI  The patient is here for annual wellness exam and preventative care.    Elevated Cholesterol:  Good control on zetia. NO SE.  Labs done at outside facility reviewed.  total chol 199, HDL 65, LDL109  CMET  Vit d, thyroid and cbc wnl Using medications without problems: none Muscle aches:  none Diet compliance: healthy diet Exercise: daily gym 3 days a week, [place] bike once a week. Other complaints:  Chronic insomnia: Stable on Ambien.  Body mass index is 27.83 kg/m. Wt Readings from Last 3 Encounters:  04/18/17 167 lb 4 oz (75.9 kg)  03/28/16 168 lb 8 oz (76.4 kg)  03/28/15 163 lb 8 oz (74.2 kg)     Social History /Family History/Past Medical History reviewed in detail and updated in EMR if needed. Blood pressure 114/80, pulse 65, temperature 98.4 F (36.9 C), temperature source Oral, height 5\' 5"  (1.651 m), weight 167 lb 4 oz (75.9 kg), last menstrual period 04/02/2017.  Review of Systems  Constitutional: Negative for fatigue and fever.  HENT: Negative for congestion.   Eyes: Negative for pain.  Respiratory: Negative for cough and shortness of breath.   Cardiovascular: Negative for chest pain, palpitations and leg swelling.  Gastrointestinal: Negative for abdominal pain.  Genitourinary: Negative for dysuria and vaginal bleeding.  Musculoskeletal: Negative for back pain.  Neurological: Negative for syncope, light-headedness and headaches.  Psychiatric/Behavioral: Negative for dysphoric mood.       Objective:   Physical Exam  Constitutional: Vital signs are normal. She appears well-developed and well-nourished. She is cooperative.  Non-toxic appearance. She does not appear ill. No distress.  HENT:  Head: Normocephalic.  Right Ear: Hearing, tympanic membrane, external ear and ear canal normal.  Left Ear: Hearing, tympanic membrane, external ear and ear canal  normal.  Nose: Nose normal.  Eyes: Conjunctivae, EOM and lids are normal. Pupils are equal, round, and reactive to light. Lids are everted and swept, no foreign bodies found.  Neck: Trachea normal and normal range of motion. Neck supple. Carotid bruit is not present. No thyroid mass and no thyromegaly present.  Cardiovascular: Normal rate, regular rhythm, S1 normal, S2 normal, normal heart sounds and intact distal pulses. Exam reveals no gallop.  No murmur heard. Pulmonary/Chest: Effort normal and breath sounds normal. No respiratory distress. She has no wheezes. She has no rhonchi. She has no rales.  Abdominal: Soft. Normal appearance and bowel sounds are normal. She exhibits no distension, no fluid wave, no abdominal bruit and no mass. There is no hepatosplenomegaly. There is no tenderness. There is no rebound, no guarding and no CVA tenderness. No hernia.  Lymphadenopathy:    She has no cervical adenopathy.    She has no axillary adenopathy.  Neurological: She is alert. She has normal strength. No cranial nerve deficit or sensory deficit.  Skin: Skin is warm, dry and intact. No rash noted.  Psychiatric: Her speech is normal and behavior is normal. Judgment normal. Her mood appears not anxious. Cognition and memory are normal. She does not exhibit a depressed mood.          Assessment & Plan:  The patient's preventative maintenance and recommended screening tests for an annual wellness exam were reviewed in full today. Brought up to date unless services declined.  Counselled on the importance of diet, exercise, and its role in overall health and mortality.  The patient's FH and SH was reviewed, including their home life, tobacco status, and drug and alcohol status.   Vaccines: Uptodate flu an tdap Parents with colon cancer age 41/55, coloncosopy 2012, due every 5 years. DUE NOW Mammo: Overdue PAP/DVE: nml pap 2016, neg HPV, on q5 years. HIV: refusedVaccines: Uptodate, flu at work  except due for tdap Parents with colon cancer age 69/55, coloncosopy 2012, due every 5 years. DUE NOW Mammo: Due PAP/DVE: nml pap 2016, neg HPV, on q5 years. HIV: refused Nonsmoker Nonsmoker

## 2017-04-18 NOTE — Patient Instructions (Addendum)
Schedule colon cancer screening and mammogram  this year.  keep up the healthy lifestyle cahnges!

## 2017-04-22 ENCOUNTER — Other Ambulatory Visit: Payer: Self-pay | Admitting: Family Medicine

## 2017-04-22 DIAGNOSIS — Z1231 Encounter for screening mammogram for malignant neoplasm of breast: Secondary | ICD-10-CM

## 2017-05-16 ENCOUNTER — Ambulatory Visit
Admission: RE | Admit: 2017-05-16 | Discharge: 2017-05-16 | Disposition: A | Discharge status: Home / Self Care | Payer: BLUE CROSS/BLUE SHIELD | Source: Ambulatory Visit | Attending: Family Medicine | Admitting: Family Medicine

## 2017-05-16 ENCOUNTER — Other Ambulatory Visit: Payer: Self-pay | Admitting: Family Medicine

## 2017-05-16 DIAGNOSIS — Z1231 Encounter for screening mammogram for malignant neoplasm of breast: Secondary | ICD-10-CM | POA: Insufficient documentation

## 2017-05-16 DIAGNOSIS — R928 Other abnormal and inconclusive findings on diagnostic imaging of breast: Secondary | ICD-10-CM | POA: Insufficient documentation

## 2017-06-03 ENCOUNTER — Other Ambulatory Visit: Payer: Self-pay | Admitting: Family Medicine

## 2017-06-03 NOTE — Telephone Encounter (Signed)
Last office visit 04/18/2017.  Last refilled 03/11/2017 for #90 with no refills.  Ok to refill?

## 2017-06-05 ENCOUNTER — Other Ambulatory Visit: Payer: Self-pay | Admitting: Internal Medicine

## 2017-06-05 ENCOUNTER — Ambulatory Visit
Admission: RE | Admit: 2017-06-05 | Discharge: 2017-06-05 | Disposition: A | Discharge status: Home / Self Care | Payer: BLUE CROSS/BLUE SHIELD | Source: Ambulatory Visit | Attending: Internal Medicine | Admitting: Internal Medicine

## 2017-06-05 DIAGNOSIS — R0602 Shortness of breath: Secondary | ICD-10-CM | POA: Insufficient documentation

## 2017-08-13 ENCOUNTER — Other Ambulatory Visit: Payer: Self-pay | Admitting: Family Medicine

## 2017-09-01 ENCOUNTER — Other Ambulatory Visit: Payer: Self-pay | Admitting: Family Medicine

## 2017-09-01 NOTE — Telephone Encounter (Signed)
Electronic refill request Last refill 06/03/17 #90 Last office visit 04/18/17

## 2017-11-28 ENCOUNTER — Other Ambulatory Visit: Payer: Self-pay | Admitting: Family Medicine

## 2017-11-28 NOTE — Telephone Encounter (Signed)
Last office visit 04/18/2017 for CPE.  No future appointments.  Last refilled 09/02/2017 for #90 with no refills.

## 2019-03-27 ENCOUNTER — Ambulatory Visit: Payer: Self-pay | Attending: Internal Medicine

## 2019-03-27 DIAGNOSIS — Z23 Encounter for immunization: Secondary | ICD-10-CM | POA: Insufficient documentation

## 2019-03-27 NOTE — Progress Notes (Signed)
   Covid-19 Vaccination Clinic  Name:  Casey Scott    MRN: [id number] DOB: 1965/08/21  03/27/2019  [name] was observed post Covid-19 immunization for 15 minutes without incidence. She was provided with Vaccine Information Sheet and instruction to access the V-Safe system.   [name] was instructed to call 911 with any severe reactions post vaccine: Marland Kitchen Difficulty breathing  . Swelling of your face and throat  . A fast heartbeat  . A bad rash all over your body  . Dizziness and weakness    Immunizations Administered    Name Date Dose VIS Date Route   Pfizer COVID-19 Vaccine 03/27/2019 10:53 AM 0.3 mL 01/22/2019 Intramuscular   Manufacturer: [place]   Lot: [id number]   [place]: [id number]

## 2019-04-17 ENCOUNTER — Ambulatory Visit: Payer: Self-pay | Attending: Internal Medicine

## 2019-04-17 ENCOUNTER — Other Ambulatory Visit: Payer: Self-pay

## 2019-04-17 DIAGNOSIS — Z23 Encounter for immunization: Secondary | ICD-10-CM | POA: Insufficient documentation

## 2019-04-17 NOTE — Progress Notes (Signed)
   Covid-19 Vaccination Clinic  Name:  Casey Scott    MRN: [id number] DOB: [birth year]  04/17/2019  [name] was observed post Covid-19 immunization for 15 minutes  without incident. She was provided with Vaccine Information Sheet and instruction to access the V-Safe system.   [name] was instructed to call 911 with any severe reactions post vaccine: Marland Kitchen Difficulty breathing  . Swelling of face and throat  . A fast heartbeat  . A bad rash all over body  . Dizziness and weakness   Immunizations Administered    Name Date Dose VIS Date Route   Pfizer COVID-19 Vaccine 04/17/2019 10:59 AM 0.3 mL 01/22/2019 Intramuscular   Manufacturer: [place]   Lot: [id number]   [place]: [id number]
